# Patient Record
Sex: Female | Born: 1960 | Race: White | Hispanic: No | Smoking: Never smoker
Health system: Southern US, Community
[De-identification: ages and names within clinical notes are randomized; demographics above are authoritative.]

## PROBLEM LIST (undated history)

## (undated) DIAGNOSIS — J45909 Unspecified asthma, uncomplicated: Secondary | ICD-10-CM

## (undated) DIAGNOSIS — R112 Nausea with vomiting, unspecified: Secondary | ICD-10-CM

## (undated) DIAGNOSIS — J302 Other seasonal allergic rhinitis: Secondary | ICD-10-CM

## (undated) DIAGNOSIS — M545 Low back pain, unspecified: Secondary | ICD-10-CM

## (undated) DIAGNOSIS — T7840XA Allergy, unspecified, initial encounter: Secondary | ICD-10-CM

## (undated) DIAGNOSIS — Z9889 Other specified postprocedural states: Secondary | ICD-10-CM

## (undated) DIAGNOSIS — F32A Depression, unspecified: Secondary | ICD-10-CM

## (undated) DIAGNOSIS — R0602 Shortness of breath: Secondary | ICD-10-CM

## (undated) DIAGNOSIS — G4700 Insomnia, unspecified: Secondary | ICD-10-CM

## (undated) DIAGNOSIS — K219 Gastro-esophageal reflux disease without esophagitis: Secondary | ICD-10-CM

## (undated) DIAGNOSIS — F329 Major depressive disorder, single episode, unspecified: Secondary | ICD-10-CM

## (undated) DIAGNOSIS — I10 Essential (primary) hypertension: Secondary | ICD-10-CM

## (undated) DIAGNOSIS — F419 Anxiety disorder, unspecified: Secondary | ICD-10-CM

## (undated) DIAGNOSIS — E669 Obesity, unspecified: Secondary | ICD-10-CM

## (undated) HISTORY — DX: Obesity, unspecified: E66.9

## (undated) HISTORY — DX: Essential (primary) hypertension: I10

## (undated) HISTORY — PX: LASIK: SHX215

## (undated) HISTORY — DX: Allergy, unspecified, initial encounter: T78.40XA

## (undated) HISTORY — DX: Other specified postprocedural states: R11.2

## (undated) HISTORY — PX: AUGMENTATION MAMMAPLASTY: SUR837

## (undated) HISTORY — DX: Shortness of breath: R06.02

## (undated) HISTORY — DX: Other specified postprocedural states: Z98.890

## (undated) HISTORY — DX: Gastro-esophageal reflux disease without esophagitis: K21.9

## (undated) HISTORY — DX: Low back pain, unspecified: M54.50

---

## 1998-08-27 ENCOUNTER — Other Ambulatory Visit: Admission: RE | Admit: 1998-08-27 | Discharge: 1998-08-27 | Payer: Self-pay | Admitting: Family Medicine

## 2000-08-08 ENCOUNTER — Other Ambulatory Visit: Admission: RE | Admit: 2000-08-08 | Discharge: 2000-08-08 | Payer: Self-pay | Admitting: Family Medicine

## 2001-04-29 ENCOUNTER — Encounter: Admission: RE | Admit: 2001-04-29 | Discharge: 2001-04-29 | Payer: Self-pay | Admitting: Family Medicine

## 2001-04-29 ENCOUNTER — Encounter: Payer: Self-pay | Admitting: Family Medicine

## 2001-11-19 ENCOUNTER — Other Ambulatory Visit: Admission: RE | Admit: 2001-11-19 | Discharge: 2001-11-19 | Payer: Self-pay | Admitting: Emergency Medicine

## 2003-02-04 ENCOUNTER — Other Ambulatory Visit: Admission: RE | Admit: 2003-02-04 | Discharge: 2003-02-04 | Payer: Self-pay | Admitting: Family Medicine

## 2004-02-09 ENCOUNTER — Other Ambulatory Visit: Admission: RE | Admit: 2004-02-09 | Discharge: 2004-02-09 | Payer: Self-pay | Admitting: Family Medicine

## 2004-03-27 HISTORY — PX: LIPOSUCTION MULTIPLE BODY PARTS: SUR832

## 2004-03-27 HISTORY — PX: BREAST ENHANCEMENT SURGERY: SHX7

## 2004-08-18 ENCOUNTER — Ambulatory Visit (HOSPITAL_COMMUNITY): Admission: RE | Admit: 2004-08-18 | Discharge: 2004-08-18 | Payer: Self-pay | Admitting: Family Medicine

## 2005-04-14 ENCOUNTER — Other Ambulatory Visit: Admission: RE | Admit: 2005-04-14 | Discharge: 2005-04-14 | Payer: Self-pay | Admitting: Family Medicine

## 2006-07-13 ENCOUNTER — Other Ambulatory Visit: Admission: RE | Admit: 2006-07-13 | Discharge: 2006-07-13 | Payer: Self-pay | Admitting: Family Medicine

## 2007-03-28 HISTORY — PX: ORIF WRIST FRACTURE: SHX2133

## 2007-10-23 ENCOUNTER — Ambulatory Visit (HOSPITAL_COMMUNITY): Admission: RE | Admit: 2007-10-23 | Discharge: 2007-10-23 | Payer: Self-pay | Admitting: Family Medicine

## 2007-10-30 ENCOUNTER — Encounter: Admission: RE | Admit: 2007-10-30 | Discharge: 2007-10-30 | Payer: Self-pay | Admitting: Family Medicine

## 2008-02-12 ENCOUNTER — Other Ambulatory Visit: Admission: RE | Admit: 2008-02-12 | Discharge: 2008-02-12 | Payer: Self-pay | Admitting: Family Medicine

## 2009-01-26 ENCOUNTER — Encounter: Admission: RE | Admit: 2009-01-26 | Discharge: 2009-01-26 | Payer: Self-pay | Admitting: Family Medicine

## 2009-02-16 ENCOUNTER — Other Ambulatory Visit: Admission: RE | Admit: 2009-02-16 | Discharge: 2009-02-16 | Payer: Self-pay | Admitting: Family Medicine

## 2011-03-01 ENCOUNTER — Other Ambulatory Visit (HOSPITAL_COMMUNITY)
Admission: RE | Admit: 2011-03-01 | Discharge: 2011-03-01 | Disposition: A | Discharge status: Home / Self Care | Payer: BC Managed Care – PPO | Source: Ambulatory Visit | Attending: Family Medicine | Admitting: Family Medicine

## 2011-03-01 ENCOUNTER — Other Ambulatory Visit: Payer: Self-pay | Admitting: Family Medicine

## 2011-03-01 DIAGNOSIS — Z124 Encounter for screening for malignant neoplasm of cervix: Secondary | ICD-10-CM | POA: Insufficient documentation

## 2011-11-21 ENCOUNTER — Other Ambulatory Visit: Payer: Self-pay | Admitting: Family Medicine

## 2011-11-21 DIAGNOSIS — Z1231 Encounter for screening mammogram for malignant neoplasm of breast: Secondary | ICD-10-CM

## 2012-01-03 ENCOUNTER — Ambulatory Visit
Admission: RE | Admit: 2012-01-03 | Discharge: 2012-01-03 | Disposition: A | Discharge status: Home / Self Care | Payer: BC Managed Care – PPO | Source: Ambulatory Visit | Attending: Family Medicine | Admitting: Family Medicine

## 2012-01-03 DIAGNOSIS — Z1231 Encounter for screening mammogram for malignant neoplasm of breast: Secondary | ICD-10-CM

## 2013-10-01 ENCOUNTER — Other Ambulatory Visit: Payer: Self-pay

## 2013-10-01 DIAGNOSIS — Z1231 Encounter for screening mammogram for malignant neoplasm of breast: Secondary | ICD-10-CM

## 2013-10-08 ENCOUNTER — Other Ambulatory Visit: Payer: Self-pay

## 2013-10-08 ENCOUNTER — Ambulatory Visit
Admission: RE | Admit: 2013-10-08 | Discharge: 2013-10-08 | Disposition: A | Discharge status: Home / Self Care | Payer: BC Managed Care – PPO | Source: Ambulatory Visit

## 2013-10-08 DIAGNOSIS — Z1231 Encounter for screening mammogram for malignant neoplasm of breast: Secondary | ICD-10-CM

## 2014-04-22 ENCOUNTER — Other Ambulatory Visit: Payer: Self-pay | Admitting: Family Medicine

## 2014-04-22 ENCOUNTER — Other Ambulatory Visit (HOSPITAL_COMMUNITY)
Admission: RE | Admit: 2014-04-22 | Discharge: 2014-04-22 | Disposition: A | Discharge status: Home / Self Care | Payer: BLUE CROSS/BLUE SHIELD | Source: Ambulatory Visit | Attending: Family Medicine | Admitting: Family Medicine

## 2014-04-22 DIAGNOSIS — Z1151 Encounter for screening for human papillomavirus (HPV): Secondary | ICD-10-CM | POA: Insufficient documentation

## 2014-04-22 DIAGNOSIS — Z124 Encounter for screening for malignant neoplasm of cervix: Secondary | ICD-10-CM | POA: Diagnosis present

## 2014-04-24 LAB — CYTOLOGY - PAP

## 2014-04-28 ENCOUNTER — Institutional Professional Consult (permissible substitution): Payer: Self-pay | Admitting: Internal Medicine

## 2014-04-28 ENCOUNTER — Other Ambulatory Visit: Payer: Self-pay | Admitting: Orthopedic Surgery

## 2014-04-28 ENCOUNTER — Encounter (HOSPITAL_BASED_OUTPATIENT_CLINIC_OR_DEPARTMENT_OTHER): Payer: Self-pay | Admitting: *Deleted

## 2014-04-28 NOTE — Progress Notes (Signed)
No labs needed

## 2014-04-29 ENCOUNTER — Ambulatory Visit (HOSPITAL_BASED_OUTPATIENT_CLINIC_OR_DEPARTMENT_OTHER)
Admission: RE | Admit: 2014-04-29 | Discharge: 2014-04-29 | Disposition: A | Discharge status: Home / Self Care | Payer: BLUE CROSS/BLUE SHIELD | Source: Ambulatory Visit | Attending: Orthopedic Surgery | Admitting: Orthopedic Surgery

## 2014-04-29 ENCOUNTER — Encounter (HOSPITAL_BASED_OUTPATIENT_CLINIC_OR_DEPARTMENT_OTHER)
Admission: RE | Disposition: A | Discharge status: Home / Self Care | Payer: Self-pay | Source: Ambulatory Visit | Attending: Orthopedic Surgery

## 2014-04-29 ENCOUNTER — Ambulatory Visit (HOSPITAL_BASED_OUTPATIENT_CLINIC_OR_DEPARTMENT_OTHER): Payer: BLUE CROSS/BLUE SHIELD | Admitting: Anesthesiology

## 2014-04-29 ENCOUNTER — Encounter (HOSPITAL_BASED_OUTPATIENT_CLINIC_OR_DEPARTMENT_OTHER): Payer: Self-pay | Admitting: *Deleted

## 2014-04-29 DIAGNOSIS — S52572A Other intraarticular fracture of lower end of left radius, initial encounter for closed fracture: Secondary | ICD-10-CM | POA: Insufficient documentation

## 2014-04-29 DIAGNOSIS — K219 Gastro-esophageal reflux disease without esophagitis: Secondary | ICD-10-CM | POA: Insufficient documentation

## 2014-04-29 DIAGNOSIS — G4700 Insomnia, unspecified: Secondary | ICD-10-CM | POA: Diagnosis not present

## 2014-04-29 DIAGNOSIS — Z9181 History of falling: Secondary | ICD-10-CM | POA: Insufficient documentation

## 2014-04-29 DIAGNOSIS — Z888 Allergy status to other drugs, medicaments and biological substances status: Secondary | ICD-10-CM | POA: Insufficient documentation

## 2014-04-29 DIAGNOSIS — Z880 Allergy status to penicillin: Secondary | ICD-10-CM | POA: Insufficient documentation

## 2014-04-29 DIAGNOSIS — F329 Major depressive disorder, single episode, unspecified: Secondary | ICD-10-CM | POA: Diagnosis not present

## 2014-04-29 DIAGNOSIS — J45909 Unspecified asthma, uncomplicated: Secondary | ICD-10-CM | POA: Insufficient documentation

## 2014-04-29 DIAGNOSIS — Z6837 Body mass index (BMI) 37.0-37.9, adult: Secondary | ICD-10-CM | POA: Insufficient documentation

## 2014-04-29 DIAGNOSIS — F419 Anxiety disorder, unspecified: Secondary | ICD-10-CM | POA: Diagnosis not present

## 2014-04-29 HISTORY — PX: ORIF WRIST FRACTURE: SHX2133

## 2014-04-29 HISTORY — DX: Major depressive disorder, single episode, unspecified: F32.9

## 2014-04-29 HISTORY — DX: Unspecified asthma, uncomplicated: J45.909

## 2014-04-29 HISTORY — DX: Depression, unspecified: F32.A

## 2014-04-29 HISTORY — DX: Gastro-esophageal reflux disease without esophagitis: K21.9

## 2014-04-29 HISTORY — DX: Anxiety disorder, unspecified: F41.9

## 2014-04-29 HISTORY — DX: Insomnia, unspecified: G47.00

## 2014-04-29 HISTORY — DX: Other seasonal allergic rhinitis: J30.2

## 2014-04-29 LAB — POCT HEMOGLOBIN-HEMACUE: Hemoglobin: 12.6 g/dL (ref 12.0–15.0)

## 2014-04-29 SURGERY — OPEN REDUCTION INTERNAL FIXATION (ORIF) WRIST FRACTURE
Anesthesia: General | Site: Wrist | Laterality: Left

## 2014-04-29 MED ORDER — CEFAZOLIN SODIUM 1-5 GM-% IV SOLN
INTRAVENOUS | Status: AC
  Filled 2014-04-29: qty 100

## 2014-04-29 MED ORDER — PROPOFOL 10 MG/ML IV BOLUS
INTRAVENOUS | Status: DC | PRN
  Administered 2014-04-29: 120 mg via INTRAVENOUS

## 2014-04-29 MED ORDER — DEXAMETHASONE SODIUM PHOSPHATE 10 MG/ML IJ SOLN
INTRAMUSCULAR | Status: DC | PRN
  Administered 2014-04-29: 10 mg via INTRAVENOUS

## 2014-04-29 MED ORDER — CEFAZOLIN SODIUM 1 G IJ SOLR: 1.0000 g | Freq: Once | INTRAMUSCULAR | Status: DC

## 2014-04-29 MED ORDER — CEFAZOLIN SODIUM 1-5 GM-% IV SOLN
1.0000 g | Freq: Once | INTRAVENOUS | Status: AC
  Administered 2014-04-29: 1 g via INTRAVENOUS

## 2014-04-29 MED ORDER — FENTANYL CITRATE 0.05 MG/ML IJ SOLN
50.0000 ug | INTRAMUSCULAR | Status: DC | PRN
  Administered 2014-04-29: 100 ug via INTRAVENOUS

## 2014-04-29 MED ORDER — OXYCODONE HCL 5 MG PO TABS: 5.0000 mg | ORAL_TABLET | Freq: Once | ORAL | Status: DC | PRN

## 2014-04-29 MED ORDER — LACTATED RINGERS IV SOLN
INTRAVENOUS | Status: DC
  Administered 2014-04-29 (×2): via INTRAVENOUS

## 2014-04-29 MED ORDER — MIDAZOLAM HCL 2 MG/2ML IJ SOLN
1.0000 mg | INTRAMUSCULAR | Status: DC | PRN
  Administered 2014-04-29: 2 mg via INTRAVENOUS

## 2014-04-29 MED ORDER — PROPOFOL 10 MG/ML IV BOLUS
INTRAVENOUS | Status: AC
  Filled 2014-04-29: qty 40

## 2014-04-29 MED ORDER — BUPIVACAINE-EPINEPHRINE (PF) 0.5% -1:200000 IJ SOLN
INTRAMUSCULAR | Status: DC | PRN
  Administered 2014-04-29: 25 mL via PERINEURAL

## 2014-04-29 MED ORDER — CHLORHEXIDINE GLUCONATE 4 % EX LIQD: 60.0000 mL | Freq: Once | CUTANEOUS | Status: DC

## 2014-04-29 MED ORDER — OXYCODONE HCL 5 MG/5ML PO SOLN: 5.0000 mg | Freq: Once | ORAL | Status: DC | PRN

## 2014-04-29 MED ORDER — MIDAZOLAM HCL 2 MG/2ML IJ SOLN
INTRAMUSCULAR | Status: AC
Start: 2014-04-29 — End: 2014-04-29
  Filled 2014-04-29: qty 2

## 2014-04-29 MED ORDER — FENTANYL CITRATE 0.05 MG/ML IJ SOLN
INTRAMUSCULAR | Status: AC
  Filled 2014-04-29: qty 2

## 2014-04-29 MED ORDER — ONDANSETRON HCL 4 MG/2ML IJ SOLN: 4.0000 mg | Freq: Once | INTRAMUSCULAR | Status: DC | PRN

## 2014-04-29 MED ORDER — ONDANSETRON HCL 4 MG/2ML IJ SOLN
INTRAMUSCULAR | Status: DC | PRN
  Administered 2014-04-29: 4 mg via INTRAVENOUS

## 2014-04-29 MED ORDER — CEFAZOLIN SODIUM-DEXTROSE 2-3 GM-% IV SOLR
2.0000 g | INTRAVENOUS | Status: AC
Start: 2014-04-30 — End: 2014-04-29
  Administered 2014-04-29: 2 g via INTRAVENOUS

## 2014-04-29 MED ORDER — HYDROMORPHONE HCL 1 MG/ML IJ SOLN: 0.2500 mg | INTRAMUSCULAR | Status: DC | PRN

## 2014-04-29 MED ORDER — FENTANYL CITRATE 0.05 MG/ML IJ SOLN
INTRAMUSCULAR | Status: AC
  Filled 2014-04-29: qty 4

## 2014-04-29 MED ORDER — CEFAZOLIN SODIUM 1-5 GM-% IV SOLN
INTRAVENOUS | Status: AC
  Filled 2014-04-29: qty 50

## 2014-04-29 SURGICAL SUPPLY — 78 items
APL SKNCLS STERI-STRIP NONHPOA (GAUZE/BANDAGES/DRESSINGS) ×1
BAG DECANTER FOR FLEXI CONT (MISCELLANEOUS) IMPLANT
BANDAGE ELASTIC 3 VELCRO ST LF (GAUZE/BANDAGES/DRESSINGS) ×4 IMPLANT
BANDAGE ELASTIC 4 VELCRO ST LF (GAUZE/BANDAGES/DRESSINGS) ×2 IMPLANT
BENZOIN TINCTURE PRP APPL 2/3 (GAUZE/BANDAGES/DRESSINGS) ×2 IMPLANT
BIT DRILL 2 FAST STEP (BIT) ×2 IMPLANT
BIT DRILL 2.5X4 QC (BIT) ×2 IMPLANT
BLADE CLIPPER SURG (BLADE) IMPLANT
BLADE SURG 15 STRL LF DISP TIS (BLADE) ×2 IMPLANT
BLADE SURG 15 STRL SS (BLADE) ×6
BNDG CMPR 9X4 STRL LF SNTH (GAUZE/BANDAGES/DRESSINGS) ×1
BNDG ESMARK 4X9 LF (GAUZE/BANDAGES/DRESSINGS) ×2 IMPLANT
CANISTER SUCT 1200ML W/VALVE (MISCELLANEOUS) IMPLANT
CLOSURE STERI-STRIP 1/2X4 (GAUZE/BANDAGES/DRESSINGS) ×1
CLOSURE WOUND 1/2 X4 (GAUZE/BANDAGES/DRESSINGS)
CLSR STERI-STRIP ANTIMIC 1/2X4 (GAUZE/BANDAGES/DRESSINGS) ×1 IMPLANT
COVER BACK TABLE 60X90IN (DRAPES) ×3 IMPLANT
COVER MAYO STAND STRL (DRAPES) ×3 IMPLANT
CUFF TOURNIQUET SINGLE 18IN (TOURNIQUET CUFF) ×2 IMPLANT
DECANTER SPIKE VIAL GLASS SM (MISCELLANEOUS) IMPLANT
DRAPE EXTREMITY T 121X128X90 (DRAPE) ×3 IMPLANT
DRAPE OEC MINIVIEW 54X84 (DRAPES) ×3 IMPLANT
DRAPE SURG 17X23 STRL (DRAPES) ×3 IMPLANT
DRSG EMULSION OIL 3X3 NADH (GAUZE/BANDAGES/DRESSINGS) IMPLANT
DURAPREP 26ML APPLICATOR (WOUND CARE) ×3 IMPLANT
ELECT REM PT RETURN 9FT ADLT (ELECTROSURGICAL) ×3
ELECTRODE REM PT RTRN 9FT ADLT (ELECTROSURGICAL) ×1 IMPLANT
GAUZE SPONGE 4X4 12PLY STRL (GAUZE/BANDAGES/DRESSINGS) ×3 IMPLANT
GLOVE BIO SURGEON STRL SZ7.5 (GLOVE) ×2 IMPLANT
GLOVE BIOGEL M STRL SZ7.5 (GLOVE) ×2 IMPLANT
GLOVE BIOGEL PI IND STRL 7.0 (GLOVE) IMPLANT
GLOVE BIOGEL PI IND STRL 8 (GLOVE) ×2 IMPLANT
GLOVE BIOGEL PI INDICATOR 7.0 (GLOVE) ×2
GLOVE BIOGEL PI INDICATOR 8 (GLOVE) ×4
GLOVE ECLIPSE 7.5 STRL STRAW (GLOVE) ×6 IMPLANT
GOWN STRL REUS W/ TWL LRG LVL3 (GOWN DISPOSABLE) ×1 IMPLANT
GOWN STRL REUS W/ TWL XL LVL3 (GOWN DISPOSABLE) ×1 IMPLANT
GOWN STRL REUS W/TWL LRG LVL3 (GOWN DISPOSABLE) ×3
GOWN STRL REUS W/TWL XL LVL3 (GOWN DISPOSABLE) ×6 IMPLANT
K-WIRE .062X4 (WIRE) ×2 IMPLANT
NEEDLE HYPO 22GX1.5 SAFETY (NEEDLE) IMPLANT
NS IRRIG 1000ML POUR BTL (IV SOLUTION) IMPLANT
PACK BASIN DAY SURGERY FS (CUSTOM PROCEDURE TRAY) ×3 IMPLANT
PAD CAST 3X4 CTTN HI CHSV (CAST SUPPLIES) ×1 IMPLANT
PADDING CAST ABS 4INX4YD NS (CAST SUPPLIES) ×2
PADDING CAST ABS COTTON 4X4 ST (CAST SUPPLIES) ×1 IMPLANT
PADDING CAST COTTON 3X4 STRL (CAST SUPPLIES) ×3
PADDING UNDERCAST 2 STRL (CAST SUPPLIES)
PADDING UNDERCAST 2X4 STRL (CAST SUPPLIES) IMPLANT
PEG SUBCHONDRAL SMOOTH 2.0X16 (Peg) ×2 IMPLANT
PEG SUBCHONDRAL SMOOTH 2.0X18 (Peg) ×8 IMPLANT
PEG SUBCHONDRAL SMOOTH 2.0X22 (Peg) ×4 IMPLANT
PENCIL BUTTON HOLSTER BLD 10FT (ELECTRODE) ×3 IMPLANT
PLATE SHORT 24.4X51.3 LT (Plate) ×2 IMPLANT
SCREW BN 12X3.5XNS CORT TI (Screw) IMPLANT
SCREW CORT 3.5X12 (Screw) ×6 IMPLANT
SCREW CORT 3.5X14 LNG (Screw) ×2 IMPLANT
SLING ARM MED ADULT FOAM STRAP (SOFTGOODS) ×2 IMPLANT
SPLINT FIBERGLASS 3X35 (CAST SUPPLIES) ×2 IMPLANT
SPLINT PLASTER CAST XFAST 3X15 (CAST SUPPLIES) IMPLANT
SPLINT PLASTER XTRA FASTSET 3X (CAST SUPPLIES)
STOCKINETTE 4X48 STRL (DRAPES) ×3 IMPLANT
STRIP CLOSURE SKIN 1/2X4 (GAUZE/BANDAGES/DRESSINGS) IMPLANT
SUCTION FRAZIER TIP 10 FR DISP (SUCTIONS) ×2 IMPLANT
SUT BONE WAX W31G (SUTURE) IMPLANT
SUT MNCRL AB 3-0 PS2 18 (SUTURE) ×3 IMPLANT
SUT VIC AB 1 CT1 27 (SUTURE)
SUT VIC AB 1 CT1 27XBRD ANBCTR (SUTURE) IMPLANT
SUT VIC AB 2-0 SH 27 (SUTURE) ×3
SUT VIC AB 2-0 SH 27XBRD (SUTURE) IMPLANT
SUT VICRYL 4-0 PS2 18IN ABS (SUTURE) ×3 IMPLANT
SYR BULB 3OZ (MISCELLANEOUS) ×3 IMPLANT
SYR CONTROL 10ML LL (SYRINGE) IMPLANT
TOWEL OR 17X24 6PK STRL BLUE (TOWEL DISPOSABLE) ×3 IMPLANT
TOWEL OR NON WOVEN STRL DISP B (DISPOSABLE) ×3 IMPLANT
TUBE CONNECTING 20'X1/4 (TUBING) ×1
TUBE CONNECTING 20X1/4 (TUBING) ×1 IMPLANT
UNDERPAD 30X30 INCONTINENT (UNDERPADS AND DIAPERS) ×3 IMPLANT

## 2014-04-29 NOTE — Brief Op Note (Signed)
04/29/2014  3:56 PM  PATIENT:  Ebony Herman  54 y.o. female  PRE-OPERATIVE DIAGNOSIS:  fractured left wrist  POST-OPERATIVE DIAGNOSIS:  fractured left wrist  PROCEDURE:  Procedure(s): OPEN REDUCTION INTERNAL FIXATION (ORIF) WRIST FRACTURE (Left)  SURGEON:  Surgeon(s) and Role:    * Alta Corning, MD - Primary  PHYSICIAN ASSISTANT:   ASSISTANTS: bethune   ANESTHESIA:   general  EBL:  Total I/O In: 1000 [I.V.:1000] Out: -   BLOOD ADMINISTERED:none  DRAINS: none   LOCAL MEDICATIONS USED:  NONE  SPECIMEN:  No Specimen  DISPOSITION OF SPECIMEN:  N/A  COUNTS:  YES  TOURNIQUET:  * Missing tourniquet times found for documented tourniquets in log:  201741 *  DICTATION: .Other Dictation: Dictation Number [id number]  PLAN OF CARE: Discharge to home after PACU  PATIENT DISPOSITION:  PACU - hemodynamically stable.   Delay start of Pharmacological VTE agent (>24hrs) due to surgical blood loss or risk of bleeding: no

## 2014-04-29 NOTE — Progress Notes (Signed)
  Assisted Dr. Crews with left, ultrasound guided, supraclavicular block. Side rails up, monitors on throughout procedure. See vital signs in flow sheet. Tolerated Procedure well. 

## 2014-04-29 NOTE — Anesthesia Postprocedure Evaluation (Signed)
  Anesthesia Post-op Note  Patient: Ebony Herman  Procedure(s) Performed: Procedure(s): OPEN REDUCTION INTERNAL FIXATION (ORIF) WRIST FRACTURE (Left)  Patient Location: PACU  Anesthesia Type:GA combined with regional for post-op pain  Level of Consciousness: awake and alert   Airway and Oxygen Therapy: Patient Spontanous Breathing  Post-op Pain: none  Post-op Assessment: Post-op Vital signs reviewed  Post-op Vital Signs: Reviewed  Last Vitals:  Filed Vitals:   04/29/14 1630  BP:   Pulse: 102  Temp:   Resp: 17    Complications: No apparent anesthesia complications

## 2014-04-29 NOTE — Transfer of Care (Signed)
Immediate Anesthesia Transfer of Care Note  Patient: Ebony Herman  Procedure(s) Performed: Procedure(s): OPEN REDUCTION INTERNAL FIXATION (ORIF) WRIST FRACTURE (Left)  Patient Location: PACU  Anesthesia Type:General and GA combined with regional for post-op pain  Level of Consciousness: awake and alert   Airway & Oxygen Therapy: Patient Spontanous Breathing and Patient connected to face mask oxygen  Post-op Assessment: Report given to RN and Post -op Vital signs reviewed and stable  Post vital signs: Reviewed and stable  Last Vitals:  Filed Vitals:   04/29/14 1441  BP:   Pulse: 93  Temp:   Resp: 18    Complications: No apparent anesthesia complications

## 2014-04-29 NOTE — Anesthesia Procedure Notes (Addendum)
Anesthesia Regional Block:  Supraclavicular block  Pre-Anesthetic Checklist: ,, timeout performed, Correct Patient, Correct Site, Correct Laterality, Correct Procedure, Correct Position, site marked, Risks and benefits discussed,  Surgical consent,  Pre-op evaluation,  At surgeon's request and post-op pain management  Laterality: Left and Upper  Prep: chloraprep       Needles:  Injection technique: Single-shot  Needle Type: Echogenic Stimulator Needle     Needle Length: 5cm 5 cm Needle Gauge: 21 and 21 G    Additional Needles:  Procedures: ultrasound guided (picture in chart) Supraclavicular block Narrative:  Start time: 04/29/2014 2:07 PM End time: 04/29/2014 2:12 PM Injection made incrementally with aspirations every 5 mL.  Performed by: Personally  Anesthesiologist: CREWS, DAVID A   Procedure Name: LMA Insertion Date/Time: 04/29/2014 2:53 PM Performed by: Maryella Shivers Pre-anesthesia Checklist: Patient identified, Emergency Drugs available, Suction available and Patient being monitored Patient Re-evaluated:Patient Re-evaluated prior to inductionOxygen Delivery Method: Circle System Utilized Preoxygenation: Pre-oxygenation with 100% oxygen Intubation Type: IV induction Ventilation: Mask ventilation without difficulty LMA: LMA inserted LMA Size: 4.0 Number of attempts: 1 Airway Equipment and Method: Bite block Placement Confirmation: positive ETCO2 Tube secured with: Tape Dental Injury: Teeth and Oropharynx as per pre-operative assessment

## 2014-04-29 NOTE — Discharge Instructions (Signed)
Elevate and ice your wrist as much as possible for a few days. Move your fingers as tolerated.   Post Anesthesia Home Care Instructions  Activity: Get plenty of rest for the remainder of the day. A responsible adult should stay with you for 24 hours following the procedure.  For the next 24 hours, DO NOT: -Drive a car -Paediatric nurse -Drink alcoholic beverages -Take any medication unless instructed by your physician -Make any legal decisions or sign important papers.  Meals: Start with liquid foods such as gelatin or soup. Progress to regular foods as tolerated. Avoid greasy, spicy, heavy foods. If nausea and/or vomiting occur, drink only clear liquids until the nausea and/or vomiting subsides. Call your physician if vomiting continues.  Special Instructions/Symptoms: Your throat may feel dry or sore from the anesthesia or the breathing tube placed in your throat during surgery. If this causes discomfort, gargle with warm salt water. The discomfort should disappear within 24 hours. Regional Anesthesia Blocks  1. Numbness or the inability to move the "blocked" extremity may last from 3-48 hours after placement. The length of time depends on the medication injected and your individual response to the medication. If the numbness is not going away after 48 hours, call your surgeon.  2. The extremity that is blocked will need to be protected until the numbness is gone and the  Strength has returned. Because you cannot feel it, you will need to take extra care to avoid injury. Because it may be weak, you may have difficulty moving it or using it. You may not know what position it is in without looking at it while the block is in effect.  3. For blocks in the legs and feet, returning to weight bearing and walking needs to be done carefully. You will need to wait until the numbness is entirely gone and the strength has returned. You should be able to move your leg and foot normally before you  try and bear weight or walk. You will need someone to be with you when you first try to ensure you do not fall and possibly risk injury.  4. Bruising and tenderness at the needle site are common side effects and will resolve in a few days.  5. Persistent numbness or new problems with movement should be communicated to the surgeon or the [facility] [phone] [place] [phone]).

## 2014-04-29 NOTE — H&P (Signed)
PREOPERATIVE H&P  Chief Complaint: left wrist pain   HPI: Ebony Herman is a 54 y.o. female who presents for evaluation of l wrist pain with intra-articular fracture and severe displcement. It has been present for 2 days and has been worsening. She has failed conservative measures. Pain is rated as severe.  Past Medical History  Diagnosis Date  . Anxiety   . Depression   . GERD (gastroesophageal reflux disease)   . Insomnia   . Seasonal allergies   . Asthma    Past Surgical History  Procedure Laterality Date  . Breast enhancement surgery  2006    implants  . Liposuction multiple body parts  2006  . Orif wrist fracture  2009    right  . Lasik     History   Social History  . Marital Status: Married    Spouse Name: N/A    Number of Children: N/A  . Years of Education: N/A   Social History Main Topics  . Smoking status: Never Smoker   . Smokeless tobacco: None  . Alcohol Use: Yes     Comment: rare  . Drug Use: No  . Sexual Activity: None   Other Topics Concern  . None   Social History Narrative  . None   History reviewed. No pertinent family history. Allergies  Allergen Reactions  . Flonase [Fluticasone Propionate]     bleeding  . Penicillins Hives    itch   Prior to Admission medications   Medication Sig Start Date End Date Taking? Authorizing Provider  albuterol (PROVENTIL HFA;VENTOLIN HFA) 108 (90 BASE) MCG/ACT inhaler Inhale into the lungs every 6 (six) hours as needed for wheezing or shortness of breath.   Yes Historical Provider, MD  ALPRAZolam Duanne Moron) 0.5 MG tablet Take 1.5 mg by mouth 3 (three) times daily as needed for anxiety.   Yes Historical Provider, MD  buPROPion (WELLBUTRIN XL) 300 MG 24 hr tablet Take 300 mg by mouth daily.   Yes Historical Provider, MD  drospirenone-ethinyl estradiol (YAZ,GIANVI,LORYNA) 3-0.02 MG tablet Take 1 tablet by mouth daily.   Yes Historical Provider, MD  FLUoxetine (PROZAC) 40 MG capsule Take 40 mg by mouth every  evening.   Yes Historical Provider, MD  Fluticasone-Salmeterol (ADVAIR) 100-50 MCG/DOSE AEPB Inhale 1 puff into the lungs 2 (two) times daily.   Yes Historical Provider, MD  HYDROmorphone (DILAUDID) 2 MG tablet Take by mouth every 4 (four) hours as needed for severe pain.   Yes Historical Provider, MD  loratadine (CLARITIN) 10 MG tablet Take 10 mg by mouth daily.   Yes Historical Provider, MD  omeprazole (PRILOSEC) 20 MG capsule Take 20 mg by mouth daily.   Yes Historical Provider, MD  zolpidem (AMBIEN) 10 MG tablet Take 10 mg by mouth at bedtime as needed for sleep.   Yes Historical Provider, MD     Positive ROS: none  All other systems have been reviewed and were otherwise negative with the exception of those mentioned in the HPI and as above.  Physical Exam: There were no vitals filed for this visit.  General: Alert, no acute distress Cardiovascular: No pedal edema Respiratory: No cyanosis, no use of accessory musculature GI: No organomegaly, abdomen is soft and non-tender Skin: No lesions in the area of chief complaint Neurologic: Sensation intact distally Psychiatric: Patient is competent for consent with normal mood and affect Lymphatic: No axillary or cervical lymphadenopathy  MUSCULOSKELETAL: l wrist markedly deformed and swollen// nvi distally  XRAY: intra-articular displaced distal radius  fracture  Assessment/Plan: fractured left wrist Plan for Procedure(s): OPEN REDUCTION INTERNAL FIXATION (ORIF) WRIST FRACTURE  The risks benefits and alternatives were discussed with the patient including but not limited to the risks of nonoperative treatment, versus surgical intervention including infection, bleeding, nerve injury, malunion, nonunion, hardware prominence, hardware failure, need for hardware removal, blood clots, cardiopulmonary complications, morbidity, mortality, among others, and they were willing to proceed.  Predicted outcome is good, although there will be at least  a six to nine month expected recovery.  Alta Corning, MD 04/29/2014 1:26 PM

## 2014-04-29 NOTE — Anesthesia Preprocedure Evaluation (Signed)
Anesthesia Evaluation  Patient identified by MRN, date of birth, ID band Patient awake    Reviewed: Allergy & Precautions, NPO status , Patient's Chart, lab work & pertinent test results  Airway Mallampati: I  TM Distance: >3 FB Neck ROM: Full    Dental  (+) Teeth Intact, Dental Advisory Given   Pulmonary asthma ,  breath sounds clear to auscultation        Cardiovascular Rhythm:Regular Rate:Normal     Neuro/Psych    GI/Hepatic   Endo/Other  Morbid obesity  Renal/GU      Musculoskeletal   Abdominal   Peds  Hematology   Anesthesia Other Findings   Reproductive/Obstetrics                             Anesthesia Physical Anesthesia Plan  ASA: II  Anesthesia Plan: General   Post-op Pain Management:    Induction: Intravenous  Airway Management Planned: LMA  Additional Equipment:   Intra-op Plan:   Post-operative Plan: Extubation in OR  Informed Consent: I have reviewed the patients History and Physical, chart, labs and discussed the procedure including the risks, benefits and alternatives for the proposed anesthesia with the patient or authorized representative who has indicated his/her understanding and acceptance.   Dental advisory given  Plan Discussed with: CRNA, Anesthesiologist and Surgeon  Anesthesia Plan Comments:         Anesthesia Quick Evaluation

## 2014-04-30 ENCOUNTER — Encounter (HOSPITAL_BASED_OUTPATIENT_CLINIC_OR_DEPARTMENT_OTHER): Payer: Self-pay | Admitting: Orthopedic Surgery

## 2014-04-30 NOTE — Op Note (Signed)
NAMEKANI, JOBSON             ACCOUNT NO.:  [id number]  MEDICAL RECORD NO.:  [id number]  LOCATION:                                 FACILITY:  PHYSICIAN:  Alta Corning, M.D.   DATE OF BIRTH:  12-19-1960  DATE OF PROCEDURE:  04/29/2014 DATE OF DISCHARGE:  04/29/2014                              OPERATIVE REPORT   PREOPERATIVE DIAGNOSIS:  Distal radius fracture with multiple intra- articular fragments.  POSTOPERATIVE DIAGNOSIS:  Distal radius fracture with multiple intra- articular fragments.  PROCEDURE: 1: Open reduction and internal fixation of left distal radius fracture with fixation of greater than 3 articular fragments. 2:interpretation of multiple intraoperative fluoroscopic images  SURGEON:  Alta Corning, M.D.  ASSISTANT:  Gary Fleet, PA  ANESTHESIA:  General.  BRIEF HISTORY:  Ms. Chyrl Civatte is a 54 year old female with a history of having falling on her left wrist.  She had a previous wrist fracture about 8 years ago.  She had done great with that, but because of the pain, she came to see [place] in the office.  X-ray showed that she had a distal radius fracture dramatically displaced with intra-articular extension.  We talked about treatment options.  We felt that open reduction and internal fixation was the only reasonable course of action.  She is brought to the operating room for this procedure.  PROCEDURE:  The patient was brought to the operating room.  After adequate anesthesia was obtained with general anesthetic, the patient was placed supine on the operating table.  The left arm was prepped and draped in sterile fashion.  Following this, the arm was exsanguinated, blood pressure tourniquet inflated to 250 mmHg.  Following this, an incision was made along the course of the flexor carpi radialis tendon subcutaneous tissue down the level of the sheath.  The tendon was retracted radially.  Incision was made in the fascia subcutaneous tissue down the  level of the bone and the pronator quadratus was identified and released on its radial border.  The fracture was then identified and a closed manipulative reduction was undertaken.  The plate was placed in the anatomic position and a guidewire placed in the plate to make sure that we were in the appropriate position.  Once we were satisfied we were in the appropriate position, a gliding hole was used and a drill was made.  The plate was then adjusted to its appropriate position under fluoroscopic imaging.  We tested a guidewire to make sure that it would work distally and it did and at that point, we felt that we were perfectly positioned and the 7 distal pegs were placed followed by the remaining 2 proximal screws.  Once this was done, the wounds were irrigated, suctioned dry, closed in layers.  The pronator quadratus was closed with 4-0 Vicryl and the skin with 2-0 Vicryl and 3-0 Monocryl subcuticular.  Benzoin and Steri-Strips were applied.  Sterile compressive dressing was applied, and the patient was taken to recovery room and was noted to be in satisfactory condition.  Estimated blood loss for the procedure was minimal.     Alta Corning, M.D.     Corliss Skains  D:  04/29/2014  T:  04/30/2014  Job:  [id number]  cc:   Alta Corning, M.D.

## 2014-10-30 ENCOUNTER — Other Ambulatory Visit: Payer: Self-pay | Admitting: Plastic Surgery

## 2015-07-08 ENCOUNTER — Ambulatory Visit (INDEPENDENT_AMBULATORY_CARE_PROVIDER_SITE_OTHER): Payer: BLUE CROSS/BLUE SHIELD | Admitting: Psychology

## 2015-07-08 DIAGNOSIS — F4323 Adjustment disorder with mixed anxiety and depressed mood: Secondary | ICD-10-CM | POA: Diagnosis not present

## 2015-08-17 ENCOUNTER — Ambulatory Visit: Payer: BLUE CROSS/BLUE SHIELD | Admitting: Psychology

## 2016-02-14 ENCOUNTER — Ambulatory Visit (INDEPENDENT_AMBULATORY_CARE_PROVIDER_SITE_OTHER): Payer: BLUE CROSS/BLUE SHIELD | Admitting: Psychology

## 2016-02-14 ENCOUNTER — Ambulatory Visit: Payer: BLUE CROSS/BLUE SHIELD | Admitting: Psychology

## 2016-02-14 DIAGNOSIS — F331 Major depressive disorder, recurrent, moderate: Secondary | ICD-10-CM | POA: Diagnosis not present

## 2016-02-24 ENCOUNTER — Ambulatory Visit (INDEPENDENT_AMBULATORY_CARE_PROVIDER_SITE_OTHER): Payer: BLUE CROSS/BLUE SHIELD | Admitting: Psychology

## 2016-02-24 DIAGNOSIS — F331 Major depressive disorder, recurrent, moderate: Secondary | ICD-10-CM

## 2016-03-30 ENCOUNTER — Ambulatory Visit (INDEPENDENT_AMBULATORY_CARE_PROVIDER_SITE_OTHER): Payer: BLUE CROSS/BLUE SHIELD | Admitting: Psychology

## 2016-03-30 DIAGNOSIS — F330 Major depressive disorder, recurrent, mild: Secondary | ICD-10-CM | POA: Diagnosis not present

## 2016-05-17 ENCOUNTER — Ambulatory Visit (INDEPENDENT_AMBULATORY_CARE_PROVIDER_SITE_OTHER): Payer: BLUE CROSS/BLUE SHIELD | Admitting: Psychology

## 2016-05-17 DIAGNOSIS — F331 Major depressive disorder, recurrent, moderate: Secondary | ICD-10-CM | POA: Diagnosis not present

## 2016-05-24 ENCOUNTER — Other Ambulatory Visit: Payer: Self-pay | Admitting: Family Medicine

## 2016-05-24 DIAGNOSIS — Z1231 Encounter for screening mammogram for malignant neoplasm of breast: Secondary | ICD-10-CM

## 2016-06-01 ENCOUNTER — Encounter: Payer: Self-pay | Admitting: Internal Medicine

## 2016-06-07 ENCOUNTER — Ambulatory Visit: Payer: BLUE CROSS/BLUE SHIELD | Admitting: Psychology

## 2016-06-12 ENCOUNTER — Other Ambulatory Visit: Payer: Self-pay | Admitting: Family Medicine

## 2016-06-12 ENCOUNTER — Ambulatory Visit
Admission: RE | Admit: 2016-06-12 | Discharge: 2016-06-12 | Disposition: A | Discharge status: Home / Self Care | Payer: BLUE CROSS/BLUE SHIELD | Source: Ambulatory Visit | Attending: Family Medicine | Admitting: Family Medicine

## 2016-06-12 DIAGNOSIS — Z1231 Encounter for screening mammogram for malignant neoplasm of breast: Secondary | ICD-10-CM

## 2016-07-26 ENCOUNTER — Ambulatory Visit (AMBULATORY_SURGERY_CENTER): Payer: Self-pay | Admitting: *Deleted

## 2016-07-26 VITALS — Ht 62.0 in | Wt 206.0 lb

## 2016-07-26 DIAGNOSIS — Z1211 Encounter for screening for malignant neoplasm of colon: Secondary | ICD-10-CM

## 2016-07-26 DIAGNOSIS — Z800 Family history of malignant neoplasm of digestive organs: Secondary | ICD-10-CM

## 2016-07-26 MED ORDER — NA SULFATE-K SULFATE-MG SULF 17.5-3.13-1.6 GM/177ML PO SOLN: ORAL | 0 refills | Status: DC

## 2016-07-26 NOTE — Progress Notes (Signed)
Patient denies any allergies to eggs or soy. Patient has post-op n&v with anesthesia. Patient denies any oxygen use at home and does not take any diet/weight loss medications. Patient refused EMMI education.

## 2016-08-14 ENCOUNTER — Encounter: Payer: Self-pay | Admitting: Internal Medicine

## 2016-08-23 ENCOUNTER — Ambulatory Visit (AMBULATORY_SURGERY_CENTER): Payer: BLUE CROSS/BLUE SHIELD | Admitting: Internal Medicine

## 2016-08-23 ENCOUNTER — Encounter: Payer: Self-pay | Admitting: Internal Medicine

## 2016-08-23 VITALS — BP 142/84 | HR 69 | Temp 96.9°F | Resp 22 | Ht 62.0 in | Wt 206.0 lb

## 2016-08-23 DIAGNOSIS — Z1211 Encounter for screening for malignant neoplasm of colon: Secondary | ICD-10-CM

## 2016-08-23 DIAGNOSIS — Z1212 Encounter for screening for malignant neoplasm of rectum: Secondary | ICD-10-CM

## 2016-08-23 DIAGNOSIS — Z800 Family history of malignant neoplasm of digestive organs: Secondary | ICD-10-CM

## 2016-08-23 DIAGNOSIS — D122 Benign neoplasm of ascending colon: Secondary | ICD-10-CM | POA: Diagnosis not present

## 2016-08-23 DIAGNOSIS — K635 Polyp of colon: Secondary | ICD-10-CM | POA: Diagnosis not present

## 2016-08-23 DIAGNOSIS — D125 Benign neoplasm of sigmoid colon: Secondary | ICD-10-CM

## 2016-08-23 MED ORDER — SODIUM CHLORIDE 0.9 % IV SOLN: 500.0000 mL | INTRAVENOUS | Status: AC

## 2016-08-23 NOTE — Op Note (Signed)
[place] Patient Name: Ebony Herman Procedure Date: 08/23/2016 9:47 AM MRN: [id number] Endoscopist: Jerene Bears , MD Age: 56 Referring MD:  Date of Birth: [birth year] Gender: Female Account #: [id number] Procedure:                Colonoscopy Indications:              Screening patient at increased risk: Family history                            of 1st-degree relative with colorectal cancer at                            age 50 years (or older), This is the patient's                            first colonoscopy Medicines:                Monitored Anesthesia Care Procedure:                Pre-Anesthesia Assessment:                           - Prior to the procedure, a History and Physical                            was performed, and patient medications and                            allergies were reviewed. The patient's tolerance of                            previous anesthesia was also reviewed. The risks                            and benefits of the procedure and the sedation                            options and risks were discussed with the patient.                            All questions were answered, and informed consent                            was obtained. Prior Anticoagulants: The patient has                            taken no previous anticoagulant or antiplatelet                            agents. ASA Grade Assessment: II - A patient with                            mild systemic disease. After reviewing the risks  and benefits, the patient was deemed in                            satisfactory condition to undergo the procedure.                           After obtaining informed consent, the colonoscope                            was passed under direct vision. Throughout the                            procedure, the patient's blood pressure, pulse, and                            oxygen saturations were monitored  continuously. The                            Model PCF-H190DL [id number] [id number]) scope was introduced                            through the anus and advanced to the the terminal                            ileum. The colonoscopy was performed without                            difficulty. The patient tolerated the procedure                            well. The quality of the bowel preparation was good                            (after copious irrigation and lavage). The terminal                            ileum, ileocecal valve, appendiceal orifice, and                            rectum were photographed. Scope In: 9:54:55 AM Scope Out: 10:14:49 AM Scope Withdrawal Time: 0 hours 15 minutes 23 seconds  Total Procedure Duration: 0 hours 19 minutes 54 seconds  Findings:                 The digital rectal exam was normal.                           The terminal ileum appeared normal.                           A 5 mm polyp was found in the ascending colon. The                            polyp was sessile. The polyp was removed with a  cold snare. Resection and retrieval were complete.                           A 3 mm polyp was found in the sigmoid colon. The                            polyp was sessile. The polyp was removed with a                            cold snare. Resection and retrieval were complete.                           A few small-mouthed diverticula were found in the                            sigmoid colon.                           The retroflexed view of the distal rectum and anal                            verge was normal and showed no anal or rectal                            abnormalities. Complications:            No immediate complications. Estimated Blood Loss:     Estimated blood loss was minimal. Impression:               - The examined portion of the ileum was normal.                           - One 5 mm polyp in the ascending colon, removed                             with a cold snare. Resected and retrieved.                           - One 3 mm polyp in the sigmoid colon, removed with                            a cold snare. Resected and retrieved.                           - Mild diverticulosis in the sigmoid colon.                           - The distal rectum and anal verge are normal on                            retroflexion view. Recommendation:           - Patient has a contact number available for  emergencies. The signs and symptoms of potential                            delayed complications were discussed with the                            patient. Return to normal activities tomorrow.                            Written discharge instructions were provided to the                            patient.                           - Resume previous diet.                           - Continue present medications.                           - Await pathology results.                           - Repeat colonoscopy is recommended. The                            colonoscopy date will be determined after pathology                            results from today's exam become available for                            review. Jerene Bears, MD 08/23/2016 10:18:34 AM This report has been signed electronically.

## 2016-08-23 NOTE — Progress Notes (Signed)
Called to room to assist during endoscopic procedure.  Patient ID and intended procedure confirmed with present staff. Received instructions for my participation in the procedure from the performing physician.  

## 2016-08-23 NOTE — Patient Instructions (Signed)
YOU HAD AN ENDOSCOPIC PROCEDURE TODAY AT [facility]:   Refer to the procedure report that was given to you for any specific questions about what was found during the examination.  If the procedure report does not answer your questions, please call your gastroenterologist to clarify.  If you requested that your care partner not be given the details of your procedure findings, then the procedure report has been included in a sealed envelope for you to review at your convenience later.  YOU SHOULD EXPECT: Some feelings of bloating in the abdomen. Passage of more gas than usual.  Walking can help get rid of the air that was put into your GI tract during the procedure and reduce the bloating. If you had a lower endoscopy (such as a colonoscopy or flexible sigmoidoscopy) you may notice spotting of blood in your stool or on the toilet paper. If you underwent a bowel prep for your procedure, you may not have a normal bowel movement for a few days.  Please Note:  You might notice some irritation and congestion in your nose or some drainage.  This is from the oxygen used during your procedure.  There is no need for concern and it should clear up in a day or so.  SYMPTOMS TO REPORT IMMEDIATELY:   Following lower endoscopy (colonoscopy or flexible sigmoidoscopy):  Excessive amounts of blood in the stool  Significant tenderness or worsening of abdominal pains  Swelling of the abdomen that is new, acute  Fever of 100F or higher   For urgent or emergent issues, a gastroenterologist can be reached at any hour by calling [phone].   DIET:  We do recommend a small meal at first, but then you may proceed to your regular diet.  Drink plenty of fluids but you should avoid alcoholic beverages for 24 hours.  ACTIVITY:  You should plan to take it easy for the rest of today and you should NOT DRIVE or use heavy machinery until tomorrow (because of the sedation medicines used during the test).     FOLLOW UP: Our staff will call the number listed on your records the next business day following your procedure to check on you and address any questions or concerns that you may have regarding the information given to you following your procedure. If we do not reach you, we will leave a message.  However, if you are feeling well and you are not experiencing any problems, there is no need to return our call.  We will assume that you have returned to your regular daily activities without incident.  If any biopsies were taken you will be contacted by phone or by letter within the next 1-3 weeks.  Please call us at [phone] if you have not heard about the biopsies in 3 weeks.    SIGNATURES/CONFIDENTIALITY: You and/or your care partner have signed paperwork which will be entered into your electronic medical record.  These signatures attest to the fact that that the information above on your After Visit Summary has been reviewed and is understood.  Full responsibility of the confidentiality of this discharge information lies with you and/or your care-partner.  Diverticulosis, high fiber diet, and polyp information given.

## 2016-08-23 NOTE — Progress Notes (Signed)
Report to PACU, RN, vss, BBS= Clear.  

## 2016-08-24 ENCOUNTER — Telehealth: Payer: Self-pay

## 2016-08-24 ENCOUNTER — Telehealth: Payer: Self-pay | Admitting: *Deleted

## 2016-08-24 NOTE — Telephone Encounter (Signed)
Attempted to reach pt. With follow up call following endoscopic procedure yesterday.  LM on pt. Ans. Machine.   Will try to reach pt. Later today.

## 2016-08-24 NOTE — Telephone Encounter (Signed)
Message left

## 2016-08-29 ENCOUNTER — Encounter: Payer: Self-pay | Admitting: Internal Medicine

## 2016-08-30 ENCOUNTER — Ambulatory Visit (INDEPENDENT_AMBULATORY_CARE_PROVIDER_SITE_OTHER): Payer: BLUE CROSS/BLUE SHIELD | Admitting: Psychology

## 2016-08-30 DIAGNOSIS — F331 Major depressive disorder, recurrent, moderate: Secondary | ICD-10-CM | POA: Diagnosis not present

## 2016-09-13 ENCOUNTER — Ambulatory Visit (INDEPENDENT_AMBULATORY_CARE_PROVIDER_SITE_OTHER): Payer: BLUE CROSS/BLUE SHIELD | Admitting: Psychology

## 2016-09-13 DIAGNOSIS — F331 Major depressive disorder, recurrent, moderate: Secondary | ICD-10-CM

## 2016-10-04 ENCOUNTER — Ambulatory Visit (INDEPENDENT_AMBULATORY_CARE_PROVIDER_SITE_OTHER): Payer: BLUE CROSS/BLUE SHIELD | Admitting: Psychology

## 2016-10-04 DIAGNOSIS — F331 Major depressive disorder, recurrent, moderate: Secondary | ICD-10-CM

## 2016-10-18 ENCOUNTER — Ambulatory Visit (INDEPENDENT_AMBULATORY_CARE_PROVIDER_SITE_OTHER): Payer: BLUE CROSS/BLUE SHIELD | Admitting: Psychology

## 2016-10-18 DIAGNOSIS — F331 Major depressive disorder, recurrent, moderate: Secondary | ICD-10-CM | POA: Diagnosis not present

## 2016-11-08 ENCOUNTER — Ambulatory Visit: Payer: BLUE CROSS/BLUE SHIELD | Admitting: Psychology

## 2016-12-11 ENCOUNTER — Ambulatory Visit (INDEPENDENT_AMBULATORY_CARE_PROVIDER_SITE_OTHER): Payer: BLUE CROSS/BLUE SHIELD | Admitting: Psychology

## 2016-12-11 DIAGNOSIS — F4323 Adjustment disorder with mixed anxiety and depressed mood: Secondary | ICD-10-CM

## 2016-12-11 DIAGNOSIS — F902 Attention-deficit hyperactivity disorder, combined type: Secondary | ICD-10-CM

## 2016-12-29 ENCOUNTER — Ambulatory Visit (INDEPENDENT_AMBULATORY_CARE_PROVIDER_SITE_OTHER): Payer: BLUE CROSS/BLUE SHIELD | Admitting: Psychology

## 2016-12-29 DIAGNOSIS — F331 Major depressive disorder, recurrent, moderate: Secondary | ICD-10-CM

## 2017-01-04 DIAGNOSIS — Z1283 Encounter for screening for malignant neoplasm of skin: Secondary | ICD-10-CM | POA: Diagnosis not present

## 2017-01-04 DIAGNOSIS — D485 Neoplasm of uncertain behavior of skin: Secondary | ICD-10-CM | POA: Diagnosis not present

## 2017-01-04 DIAGNOSIS — D225 Melanocytic nevi of trunk: Secondary | ICD-10-CM | POA: Diagnosis not present

## 2017-01-04 DIAGNOSIS — L308 Other specified dermatitis: Secondary | ICD-10-CM | POA: Diagnosis not present

## 2017-01-04 DIAGNOSIS — L905 Scar conditions and fibrosis of skin: Secondary | ICD-10-CM | POA: Diagnosis not present

## 2017-01-23 DIAGNOSIS — F331 Major depressive disorder, recurrent, moderate: Secondary | ICD-10-CM | POA: Diagnosis not present

## 2017-01-23 DIAGNOSIS — F5081 Binge eating disorder: Secondary | ICD-10-CM | POA: Diagnosis not present

## 2017-01-23 DIAGNOSIS — F411 Generalized anxiety disorder: Secondary | ICD-10-CM | POA: Diagnosis not present

## 2017-01-29 DIAGNOSIS — L089 Local infection of the skin and subcutaneous tissue, unspecified: Secondary | ICD-10-CM | POA: Diagnosis not present

## 2017-01-29 DIAGNOSIS — D485 Neoplasm of uncertain behavior of skin: Secondary | ICD-10-CM | POA: Diagnosis not present

## 2017-01-29 DIAGNOSIS — D225 Melanocytic nevi of trunk: Secondary | ICD-10-CM | POA: Diagnosis not present

## 2017-04-23 DIAGNOSIS — F331 Major depressive disorder, recurrent, moderate: Secondary | ICD-10-CM | POA: Diagnosis not present

## 2017-04-23 DIAGNOSIS — F5081 Binge eating disorder: Secondary | ICD-10-CM | POA: Diagnosis not present

## 2017-04-23 DIAGNOSIS — F411 Generalized anxiety disorder: Secondary | ICD-10-CM | POA: Diagnosis not present

## 2017-07-26 DIAGNOSIS — F331 Major depressive disorder, recurrent, moderate: Secondary | ICD-10-CM | POA: Diagnosis not present

## 2017-07-26 DIAGNOSIS — F5081 Binge eating disorder: Secondary | ICD-10-CM | POA: Diagnosis not present

## 2017-07-26 DIAGNOSIS — F411 Generalized anxiety disorder: Secondary | ICD-10-CM | POA: Diagnosis not present

## 2017-09-25 DIAGNOSIS — F331 Major depressive disorder, recurrent, moderate: Secondary | ICD-10-CM | POA: Diagnosis not present

## 2017-10-29 DIAGNOSIS — F5081 Binge eating disorder: Secondary | ICD-10-CM | POA: Diagnosis not present

## 2017-10-29 DIAGNOSIS — F331 Major depressive disorder, recurrent, moderate: Secondary | ICD-10-CM | POA: Diagnosis not present

## 2017-10-29 DIAGNOSIS — F411 Generalized anxiety disorder: Secondary | ICD-10-CM | POA: Diagnosis not present

## 2018-01-01 ENCOUNTER — Other Ambulatory Visit: Payer: Self-pay | Admitting: Family Medicine

## 2018-01-01 DIAGNOSIS — Z1231 Encounter for screening mammogram for malignant neoplasm of breast: Secondary | ICD-10-CM

## 2018-01-17 DIAGNOSIS — D485 Neoplasm of uncertain behavior of skin: Secondary | ICD-10-CM | POA: Diagnosis not present

## 2018-01-17 DIAGNOSIS — Z1283 Encounter for screening for malignant neoplasm of skin: Secondary | ICD-10-CM | POA: Diagnosis not present

## 2018-01-17 DIAGNOSIS — B353 Tinea pedis: Secondary | ICD-10-CM | POA: Diagnosis not present

## 2018-01-17 DIAGNOSIS — D225 Melanocytic nevi of trunk: Secondary | ICD-10-CM | POA: Diagnosis not present

## 2018-02-11 ENCOUNTER — Ambulatory Visit
Admission: RE | Admit: 2018-02-11 | Discharge: 2018-02-11 | Disposition: A | Discharge status: Home / Self Care | Payer: Self-pay | Source: Ambulatory Visit | Attending: Family Medicine | Admitting: Family Medicine

## 2018-02-11 DIAGNOSIS — Z1231 Encounter for screening mammogram for malignant neoplasm of breast: Secondary | ICD-10-CM | POA: Diagnosis not present

## 2018-03-05 ENCOUNTER — Ambulatory Visit: Payer: BLUE CROSS/BLUE SHIELD | Admitting: Psychology

## 2018-04-08 DIAGNOSIS — F5081 Binge eating disorder: Secondary | ICD-10-CM | POA: Diagnosis not present

## 2018-04-08 DIAGNOSIS — F331 Major depressive disorder, recurrent, moderate: Secondary | ICD-10-CM | POA: Diagnosis not present

## 2018-04-08 DIAGNOSIS — F411 Generalized anxiety disorder: Secondary | ICD-10-CM | POA: Diagnosis not present

## 2018-04-25 ENCOUNTER — Ambulatory Visit (INDEPENDENT_AMBULATORY_CARE_PROVIDER_SITE_OTHER): Payer: BLUE CROSS/BLUE SHIELD | Admitting: Psychology

## 2018-04-25 DIAGNOSIS — F331 Major depressive disorder, recurrent, moderate: Secondary | ICD-10-CM | POA: Diagnosis not present

## 2018-04-26 ENCOUNTER — Ambulatory Visit: Payer: BLUE CROSS/BLUE SHIELD | Admitting: Psychology

## 2018-05-14 ENCOUNTER — Ambulatory Visit (INDEPENDENT_AMBULATORY_CARE_PROVIDER_SITE_OTHER): Payer: BLUE CROSS/BLUE SHIELD | Admitting: Psychology

## 2018-05-14 DIAGNOSIS — F331 Major depressive disorder, recurrent, moderate: Secondary | ICD-10-CM | POA: Diagnosis not present

## 2018-05-14 DIAGNOSIS — F4323 Adjustment disorder with mixed anxiety and depressed mood: Secondary | ICD-10-CM

## 2018-05-20 ENCOUNTER — Ambulatory Visit: Payer: BLUE CROSS/BLUE SHIELD | Admitting: Psychology

## 2018-05-21 ENCOUNTER — Ambulatory Visit (INDEPENDENT_AMBULATORY_CARE_PROVIDER_SITE_OTHER): Payer: BLUE CROSS/BLUE SHIELD | Admitting: Psychology

## 2018-05-21 DIAGNOSIS — F4323 Adjustment disorder with mixed anxiety and depressed mood: Secondary | ICD-10-CM | POA: Diagnosis not present

## 2018-05-28 ENCOUNTER — Ambulatory Visit (INDEPENDENT_AMBULATORY_CARE_PROVIDER_SITE_OTHER): Payer: BLUE CROSS/BLUE SHIELD | Admitting: Psychology

## 2018-05-28 DIAGNOSIS — F4323 Adjustment disorder with mixed anxiety and depressed mood: Secondary | ICD-10-CM

## 2018-07-02 ENCOUNTER — Ambulatory Visit (INDEPENDENT_AMBULATORY_CARE_PROVIDER_SITE_OTHER): Payer: BLUE CROSS/BLUE SHIELD | Admitting: Psychology

## 2018-07-02 DIAGNOSIS — F331 Major depressive disorder, recurrent, moderate: Secondary | ICD-10-CM | POA: Diagnosis not present

## 2018-07-11 ENCOUNTER — Ambulatory Visit (INDEPENDENT_AMBULATORY_CARE_PROVIDER_SITE_OTHER): Payer: BLUE CROSS/BLUE SHIELD | Admitting: Psychology

## 2018-07-11 DIAGNOSIS — F331 Major depressive disorder, recurrent, moderate: Secondary | ICD-10-CM | POA: Diagnosis not present

## 2018-07-17 ENCOUNTER — Ambulatory Visit (INDEPENDENT_AMBULATORY_CARE_PROVIDER_SITE_OTHER): Payer: BLUE CROSS/BLUE SHIELD | Admitting: Psychology

## 2018-07-17 DIAGNOSIS — F331 Major depressive disorder, recurrent, moderate: Secondary | ICD-10-CM

## 2018-07-18 DIAGNOSIS — F4323 Adjustment disorder with mixed anxiety and depressed mood: Secondary | ICD-10-CM | POA: Diagnosis not present

## 2018-07-18 DIAGNOSIS — F5081 Binge eating disorder: Secondary | ICD-10-CM | POA: Diagnosis not present

## 2018-07-18 DIAGNOSIS — F411 Generalized anxiety disorder: Secondary | ICD-10-CM | POA: Diagnosis not present

## 2018-07-18 DIAGNOSIS — F331 Major depressive disorder, recurrent, moderate: Secondary | ICD-10-CM | POA: Diagnosis not present

## 2018-07-25 ENCOUNTER — Ambulatory Visit (INDEPENDENT_AMBULATORY_CARE_PROVIDER_SITE_OTHER): Payer: BLUE CROSS/BLUE SHIELD | Admitting: Psychology

## 2018-07-25 DIAGNOSIS — F331 Major depressive disorder, recurrent, moderate: Secondary | ICD-10-CM

## 2018-07-31 ENCOUNTER — Ambulatory Visit (INDEPENDENT_AMBULATORY_CARE_PROVIDER_SITE_OTHER): Payer: BLUE CROSS/BLUE SHIELD | Admitting: Psychology

## 2018-07-31 DIAGNOSIS — F331 Major depressive disorder, recurrent, moderate: Secondary | ICD-10-CM

## 2018-08-08 ENCOUNTER — Ambulatory Visit (INDEPENDENT_AMBULATORY_CARE_PROVIDER_SITE_OTHER): Payer: BLUE CROSS/BLUE SHIELD | Admitting: Psychology

## 2018-08-08 DIAGNOSIS — F4323 Adjustment disorder with mixed anxiety and depressed mood: Secondary | ICD-10-CM

## 2018-08-08 DIAGNOSIS — F331 Major depressive disorder, recurrent, moderate: Secondary | ICD-10-CM

## 2018-08-15 ENCOUNTER — Ambulatory Visit (INDEPENDENT_AMBULATORY_CARE_PROVIDER_SITE_OTHER): Payer: BLUE CROSS/BLUE SHIELD | Admitting: Psychology

## 2018-08-15 DIAGNOSIS — F331 Major depressive disorder, recurrent, moderate: Secondary | ICD-10-CM | POA: Diagnosis not present

## 2018-08-28 ENCOUNTER — Ambulatory Visit: Payer: BLUE CROSS/BLUE SHIELD | Admitting: Psychology

## 2018-09-02 ENCOUNTER — Ambulatory Visit (INDEPENDENT_AMBULATORY_CARE_PROVIDER_SITE_OTHER): Payer: BC Managed Care – PPO | Admitting: Psychology

## 2018-09-02 DIAGNOSIS — F331 Major depressive disorder, recurrent, moderate: Secondary | ICD-10-CM | POA: Diagnosis not present

## 2018-09-12 ENCOUNTER — Ambulatory Visit (INDEPENDENT_AMBULATORY_CARE_PROVIDER_SITE_OTHER): Payer: BC Managed Care – PPO | Admitting: Psychology

## 2018-09-12 DIAGNOSIS — F4323 Adjustment disorder with mixed anxiety and depressed mood: Secondary | ICD-10-CM

## 2018-09-12 DIAGNOSIS — F331 Major depressive disorder, recurrent, moderate: Secondary | ICD-10-CM

## 2018-09-19 ENCOUNTER — Ambulatory Visit (INDEPENDENT_AMBULATORY_CARE_PROVIDER_SITE_OTHER): Payer: BC Managed Care – PPO | Admitting: Psychology

## 2018-09-19 DIAGNOSIS — F331 Major depressive disorder, recurrent, moderate: Secondary | ICD-10-CM | POA: Diagnosis not present

## 2018-09-30 ENCOUNTER — Ambulatory Visit (INDEPENDENT_AMBULATORY_CARE_PROVIDER_SITE_OTHER): Payer: BC Managed Care – PPO | Admitting: Psychology

## 2018-09-30 DIAGNOSIS — F5081 Binge eating disorder: Secondary | ICD-10-CM | POA: Diagnosis not present

## 2018-09-30 DIAGNOSIS — F331 Major depressive disorder, recurrent, moderate: Secondary | ICD-10-CM | POA: Diagnosis not present

## 2018-09-30 DIAGNOSIS — F411 Generalized anxiety disorder: Secondary | ICD-10-CM | POA: Diagnosis not present

## 2018-09-30 DIAGNOSIS — F4323 Adjustment disorder with mixed anxiety and depressed mood: Secondary | ICD-10-CM | POA: Diagnosis not present

## 2018-10-09 ENCOUNTER — Ambulatory Visit (INDEPENDENT_AMBULATORY_CARE_PROVIDER_SITE_OTHER): Payer: BC Managed Care – PPO | Admitting: Psychology

## 2018-10-09 DIAGNOSIS — F331 Major depressive disorder, recurrent, moderate: Secondary | ICD-10-CM | POA: Diagnosis not present

## 2018-10-16 ENCOUNTER — Ambulatory Visit (INDEPENDENT_AMBULATORY_CARE_PROVIDER_SITE_OTHER): Payer: BC Managed Care – PPO | Admitting: Psychology

## 2018-10-16 DIAGNOSIS — F331 Major depressive disorder, recurrent, moderate: Secondary | ICD-10-CM

## 2018-11-06 ENCOUNTER — Ambulatory Visit (INDEPENDENT_AMBULATORY_CARE_PROVIDER_SITE_OTHER): Payer: BC Managed Care – PPO | Admitting: Psychology

## 2018-11-06 DIAGNOSIS — F4323 Adjustment disorder with mixed anxiety and depressed mood: Secondary | ICD-10-CM | POA: Diagnosis not present

## 2018-11-21 ENCOUNTER — Ambulatory Visit (INDEPENDENT_AMBULATORY_CARE_PROVIDER_SITE_OTHER): Payer: BC Managed Care – PPO | Admitting: Psychology

## 2018-11-21 DIAGNOSIS — F4323 Adjustment disorder with mixed anxiety and depressed mood: Secondary | ICD-10-CM

## 2018-11-21 DIAGNOSIS — F3110 Bipolar disorder, current episode manic without psychotic features, unspecified: Secondary | ICD-10-CM | POA: Diagnosis not present

## 2018-12-04 ENCOUNTER — Ambulatory Visit (INDEPENDENT_AMBULATORY_CARE_PROVIDER_SITE_OTHER): Payer: BC Managed Care – PPO | Admitting: Psychology

## 2018-12-04 DIAGNOSIS — F3110 Bipolar disorder, current episode manic without psychotic features, unspecified: Secondary | ICD-10-CM | POA: Diagnosis not present

## 2018-12-16 DIAGNOSIS — J309 Allergic rhinitis, unspecified: Secondary | ICD-10-CM | POA: Diagnosis not present

## 2018-12-16 DIAGNOSIS — J45909 Unspecified asthma, uncomplicated: Secondary | ICD-10-CM | POA: Diagnosis not present

## 2018-12-19 ENCOUNTER — Ambulatory Visit (INDEPENDENT_AMBULATORY_CARE_PROVIDER_SITE_OTHER): Payer: BC Managed Care – PPO | Admitting: Psychology

## 2018-12-19 DIAGNOSIS — F321 Major depressive disorder, single episode, moderate: Secondary | ICD-10-CM

## 2018-12-31 ENCOUNTER — Ambulatory Visit (INDEPENDENT_AMBULATORY_CARE_PROVIDER_SITE_OTHER): Payer: BC Managed Care – PPO | Admitting: Psychology

## 2018-12-31 DIAGNOSIS — F331 Major depressive disorder, recurrent, moderate: Secondary | ICD-10-CM

## 2019-01-14 ENCOUNTER — Ambulatory Visit (INDEPENDENT_AMBULATORY_CARE_PROVIDER_SITE_OTHER): Payer: BC Managed Care – PPO | Admitting: Psychology

## 2019-01-14 DIAGNOSIS — F331 Major depressive disorder, recurrent, moderate: Secondary | ICD-10-CM

## 2019-01-17 DIAGNOSIS — F331 Major depressive disorder, recurrent, moderate: Secondary | ICD-10-CM | POA: Diagnosis not present

## 2019-01-17 DIAGNOSIS — F4323 Adjustment disorder with mixed anxiety and depressed mood: Secondary | ICD-10-CM | POA: Diagnosis not present

## 2019-01-17 DIAGNOSIS — F411 Generalized anxiety disorder: Secondary | ICD-10-CM | POA: Diagnosis not present

## 2019-01-17 DIAGNOSIS — F5081 Binge eating disorder: Secondary | ICD-10-CM | POA: Diagnosis not present

## 2019-01-29 ENCOUNTER — Ambulatory Visit (INDEPENDENT_AMBULATORY_CARE_PROVIDER_SITE_OTHER): Payer: BC Managed Care – PPO | Admitting: Psychology

## 2019-01-29 DIAGNOSIS — F331 Major depressive disorder, recurrent, moderate: Secondary | ICD-10-CM

## 2019-01-29 DIAGNOSIS — F4323 Adjustment disorder with mixed anxiety and depressed mood: Secondary | ICD-10-CM

## 2019-02-11 ENCOUNTER — Ambulatory Visit (INDEPENDENT_AMBULATORY_CARE_PROVIDER_SITE_OTHER): Payer: BC Managed Care – PPO | Admitting: Psychology

## 2019-02-11 DIAGNOSIS — F331 Major depressive disorder, recurrent, moderate: Secondary | ICD-10-CM

## 2019-02-19 ENCOUNTER — Other Ambulatory Visit: Payer: Self-pay | Admitting: Family Medicine

## 2019-02-19 DIAGNOSIS — Z1231 Encounter for screening mammogram for malignant neoplasm of breast: Secondary | ICD-10-CM

## 2019-03-03 ENCOUNTER — Ambulatory Visit (INDEPENDENT_AMBULATORY_CARE_PROVIDER_SITE_OTHER): Payer: BC Managed Care – PPO | Admitting: Psychology

## 2019-03-03 DIAGNOSIS — F331 Major depressive disorder, recurrent, moderate: Secondary | ICD-10-CM

## 2019-03-05 ENCOUNTER — Other Ambulatory Visit (HOSPITAL_COMMUNITY)
Admission: RE | Admit: 2019-03-05 | Discharge: 2019-03-05 | Disposition: A | Discharge status: Home / Self Care | Payer: BC Managed Care – PPO | Source: Ambulatory Visit | Attending: Family Medicine | Admitting: Family Medicine

## 2019-03-05 DIAGNOSIS — Z01411 Encounter for gynecological examination (general) (routine) with abnormal findings: Secondary | ICD-10-CM | POA: Insufficient documentation

## 2019-03-05 DIAGNOSIS — E785 Hyperlipidemia, unspecified: Secondary | ICD-10-CM | POA: Diagnosis not present

## 2019-03-05 DIAGNOSIS — Z23 Encounter for immunization: Secondary | ICD-10-CM | POA: Diagnosis not present

## 2019-03-05 DIAGNOSIS — Z0000 Encounter for general adult medical examination without abnormal findings: Secondary | ICD-10-CM | POA: Diagnosis not present

## 2019-03-06 ENCOUNTER — Other Ambulatory Visit: Payer: Self-pay | Admitting: Family Medicine

## 2019-03-10 LAB — CYTOLOGY - PAP
Comment: NEGATIVE
Diagnosis: NEGATIVE
High risk HPV: NEGATIVE

## 2019-04-16 ENCOUNTER — Ambulatory Visit: Payer: Self-pay

## 2019-04-17 DIAGNOSIS — F5081 Binge eating disorder: Secondary | ICD-10-CM | POA: Diagnosis not present

## 2019-04-17 DIAGNOSIS — F411 Generalized anxiety disorder: Secondary | ICD-10-CM | POA: Diagnosis not present

## 2019-04-17 DIAGNOSIS — F331 Major depressive disorder, recurrent, moderate: Secondary | ICD-10-CM | POA: Diagnosis not present

## 2019-06-02 ENCOUNTER — Ambulatory Visit (INDEPENDENT_AMBULATORY_CARE_PROVIDER_SITE_OTHER): Payer: BC Managed Care – PPO | Admitting: Psychology

## 2019-06-02 DIAGNOSIS — F331 Major depressive disorder, recurrent, moderate: Secondary | ICD-10-CM | POA: Diagnosis not present

## 2019-06-23 ENCOUNTER — Ambulatory Visit (INDEPENDENT_AMBULATORY_CARE_PROVIDER_SITE_OTHER): Payer: BC Managed Care – PPO | Admitting: Psychology

## 2019-06-23 DIAGNOSIS — F4323 Adjustment disorder with mixed anxiety and depressed mood: Secondary | ICD-10-CM | POA: Diagnosis not present

## 2019-06-23 DIAGNOSIS — F331 Major depressive disorder, recurrent, moderate: Secondary | ICD-10-CM | POA: Diagnosis not present

## 2019-07-17 DIAGNOSIS — F331 Major depressive disorder, recurrent, moderate: Secondary | ICD-10-CM | POA: Diagnosis not present

## 2019-07-17 DIAGNOSIS — F411 Generalized anxiety disorder: Secondary | ICD-10-CM | POA: Diagnosis not present

## 2019-07-17 DIAGNOSIS — F4323 Adjustment disorder with mixed anxiety and depressed mood: Secondary | ICD-10-CM | POA: Diagnosis not present

## 2019-07-17 DIAGNOSIS — F5081 Binge eating disorder: Secondary | ICD-10-CM | POA: Diagnosis not present

## 2019-08-18 ENCOUNTER — Ambulatory Visit (INDEPENDENT_AMBULATORY_CARE_PROVIDER_SITE_OTHER): Payer: BC Managed Care – PPO | Admitting: Psychology

## 2019-08-18 DIAGNOSIS — F331 Major depressive disorder, recurrent, moderate: Secondary | ICD-10-CM

## 2019-09-08 ENCOUNTER — Ambulatory Visit (INDEPENDENT_AMBULATORY_CARE_PROVIDER_SITE_OTHER): Payer: BC Managed Care – PPO | Admitting: Psychology

## 2019-09-08 DIAGNOSIS — F331 Major depressive disorder, recurrent, moderate: Secondary | ICD-10-CM

## 2019-10-06 ENCOUNTER — Ambulatory Visit (INDEPENDENT_AMBULATORY_CARE_PROVIDER_SITE_OTHER): Payer: BC Managed Care – PPO | Admitting: Psychology

## 2019-10-06 DIAGNOSIS — F331 Major depressive disorder, recurrent, moderate: Secondary | ICD-10-CM | POA: Diagnosis not present

## 2019-10-27 ENCOUNTER — Ambulatory Visit (INDEPENDENT_AMBULATORY_CARE_PROVIDER_SITE_OTHER): Payer: BC Managed Care – PPO | Admitting: Psychology

## 2019-10-27 DIAGNOSIS — F331 Major depressive disorder, recurrent, moderate: Secondary | ICD-10-CM | POA: Diagnosis not present

## 2019-11-03 DIAGNOSIS — F4323 Adjustment disorder with mixed anxiety and depressed mood: Secondary | ICD-10-CM | POA: Diagnosis not present

## 2019-11-03 DIAGNOSIS — F331 Major depressive disorder, recurrent, moderate: Secondary | ICD-10-CM | POA: Diagnosis not present

## 2019-11-03 DIAGNOSIS — F5081 Binge eating disorder: Secondary | ICD-10-CM | POA: Diagnosis not present

## 2019-11-03 DIAGNOSIS — F411 Generalized anxiety disorder: Secondary | ICD-10-CM | POA: Diagnosis not present

## 2019-11-10 ENCOUNTER — Ambulatory Visit: Payer: BC Managed Care – PPO | Admitting: Psychology

## 2019-11-10 ENCOUNTER — Ambulatory Visit (INDEPENDENT_AMBULATORY_CARE_PROVIDER_SITE_OTHER): Payer: BC Managed Care – PPO | Admitting: Psychology

## 2019-11-10 DIAGNOSIS — F3110 Bipolar disorder, current episode manic without psychotic features, unspecified: Secondary | ICD-10-CM | POA: Diagnosis not present

## 2019-11-25 ENCOUNTER — Ambulatory Visit (INDEPENDENT_AMBULATORY_CARE_PROVIDER_SITE_OTHER): Payer: BC Managed Care – PPO | Admitting: Psychology

## 2019-11-25 DIAGNOSIS — F331 Major depressive disorder, recurrent, moderate: Secondary | ICD-10-CM | POA: Diagnosis not present

## 2019-12-04 DIAGNOSIS — F411 Generalized anxiety disorder: Secondary | ICD-10-CM | POA: Diagnosis not present

## 2019-12-04 DIAGNOSIS — F5081 Binge eating disorder: Secondary | ICD-10-CM | POA: Diagnosis not present

## 2019-12-04 DIAGNOSIS — F331 Major depressive disorder, recurrent, moderate: Secondary | ICD-10-CM | POA: Diagnosis not present

## 2019-12-04 DIAGNOSIS — F4323 Adjustment disorder with mixed anxiety and depressed mood: Secondary | ICD-10-CM | POA: Diagnosis not present

## 2019-12-06 DIAGNOSIS — Z20822 Contact with and (suspected) exposure to covid-19: Secondary | ICD-10-CM | POA: Diagnosis not present

## 2019-12-10 ENCOUNTER — Ambulatory Visit (INDEPENDENT_AMBULATORY_CARE_PROVIDER_SITE_OTHER): Payer: BC Managed Care – PPO | Admitting: Psychology

## 2019-12-10 DIAGNOSIS — F3110 Bipolar disorder, current episode manic without psychotic features, unspecified: Secondary | ICD-10-CM

## 2019-12-23 ENCOUNTER — Ambulatory Visit (INDEPENDENT_AMBULATORY_CARE_PROVIDER_SITE_OTHER): Payer: BC Managed Care – PPO | Admitting: Psychology

## 2019-12-23 DIAGNOSIS — F4323 Adjustment disorder with mixed anxiety and depressed mood: Secondary | ICD-10-CM | POA: Diagnosis not present

## 2019-12-23 DIAGNOSIS — F331 Major depressive disorder, recurrent, moderate: Secondary | ICD-10-CM | POA: Diagnosis not present

## 2020-01-13 ENCOUNTER — Ambulatory Visit (INDEPENDENT_AMBULATORY_CARE_PROVIDER_SITE_OTHER): Payer: BC Managed Care – PPO | Admitting: Psychology

## 2020-01-13 DIAGNOSIS — F331 Major depressive disorder, recurrent, moderate: Secondary | ICD-10-CM | POA: Diagnosis not present

## 2020-01-27 ENCOUNTER — Ambulatory Visit (INDEPENDENT_AMBULATORY_CARE_PROVIDER_SITE_OTHER): Payer: BC Managed Care – PPO | Admitting: Psychology

## 2020-01-27 DIAGNOSIS — F4323 Adjustment disorder with mixed anxiety and depressed mood: Secondary | ICD-10-CM | POA: Diagnosis not present

## 2020-01-27 DIAGNOSIS — F331 Major depressive disorder, recurrent, moderate: Secondary | ICD-10-CM | POA: Diagnosis not present

## 2020-02-16 DIAGNOSIS — R059 Cough, unspecified: Secondary | ICD-10-CM | POA: Diagnosis not present

## 2020-05-04 ENCOUNTER — Other Ambulatory Visit: Payer: Self-pay | Admitting: *Deleted

## 2020-05-04 DIAGNOSIS — Z1231 Encounter for screening mammogram for malignant neoplasm of breast: Secondary | ICD-10-CM

## 2020-12-23 ENCOUNTER — Ambulatory Visit (INDEPENDENT_AMBULATORY_CARE_PROVIDER_SITE_OTHER): Payer: 59 | Admitting: Psychology

## 2020-12-23 DIAGNOSIS — F331 Major depressive disorder, recurrent, moderate: Secondary | ICD-10-CM

## 2021-01-05 ENCOUNTER — Ambulatory Visit (INDEPENDENT_AMBULATORY_CARE_PROVIDER_SITE_OTHER): Payer: 59 | Admitting: Psychology

## 2021-01-05 DIAGNOSIS — F331 Major depressive disorder, recurrent, moderate: Secondary | ICD-10-CM

## 2021-01-06 ENCOUNTER — Ambulatory Visit: Payer: 59 | Admitting: Psychology

## 2021-01-10 ENCOUNTER — Ambulatory Visit (INDEPENDENT_AMBULATORY_CARE_PROVIDER_SITE_OTHER): Payer: 59 | Admitting: Psychology

## 2021-01-10 DIAGNOSIS — F331 Major depressive disorder, recurrent, moderate: Secondary | ICD-10-CM

## 2021-01-20 ENCOUNTER — Ambulatory Visit: Payer: 59 | Admitting: Psychology

## 2021-01-27 ENCOUNTER — Ambulatory Visit (INDEPENDENT_AMBULATORY_CARE_PROVIDER_SITE_OTHER): Payer: 59 | Admitting: Psychology

## 2021-01-27 DIAGNOSIS — F331 Major depressive disorder, recurrent, moderate: Secondary | ICD-10-CM

## 2021-02-07 ENCOUNTER — Ambulatory Visit (INDEPENDENT_AMBULATORY_CARE_PROVIDER_SITE_OTHER): Payer: 59 | Admitting: Psychology

## 2021-02-07 DIAGNOSIS — F331 Major depressive disorder, recurrent, moderate: Secondary | ICD-10-CM | POA: Diagnosis not present

## 2021-02-24 ENCOUNTER — Ambulatory Visit (INDEPENDENT_AMBULATORY_CARE_PROVIDER_SITE_OTHER): Payer: 59 | Admitting: Psychology

## 2021-02-24 DIAGNOSIS — F330 Major depressive disorder, recurrent, mild: Secondary | ICD-10-CM

## 2021-03-07 ENCOUNTER — Ambulatory Visit: Payer: 59 | Admitting: Psychology

## 2021-03-27 HISTORY — PX: BREAST BIOPSY: SHX20

## 2021-03-29 ENCOUNTER — Other Ambulatory Visit: Payer: Self-pay | Admitting: Family Medicine

## 2021-03-29 DIAGNOSIS — Z1231 Encounter for screening mammogram for malignant neoplasm of breast: Secondary | ICD-10-CM

## 2021-03-30 ENCOUNTER — Ambulatory Visit (INDEPENDENT_AMBULATORY_CARE_PROVIDER_SITE_OTHER): Payer: 59 | Admitting: Psychology

## 2021-03-30 DIAGNOSIS — F331 Major depressive disorder, recurrent, moderate: Secondary | ICD-10-CM | POA: Diagnosis not present

## 2021-03-30 NOTE — Progress Notes (Signed)
Progress Note Start: 02/24/2021 10:00 AM End: 02/24/2021 10:55 AM Diagnosis:   F 34.1 Major Depressive Disorder, recurrent, moderate   F 42.1 Generalized Anxiety Disorder * r/o Bipolar II Disorder w/ anxious distress Symptoms Depressed or irritable mood. (Status: maintained) -- No Description Entered  Difficulty in saying no to others; assumes not being liked by others. (Status: maintained) -- No Description Entered  Fear of rejection by others, especially peer group. (Status: maintained) -- No Description Entered  Feelings of hopelessness, worthlessness, or inappropriate guilt. (Status: maintained) -- No Description Entered  History of chronic or recurrent depression for which the client has taken antidepressant medication, been hospitalized, had outpatient treatment, or had a course of electroconvulsive therapy. (Status: maintained) -- No Description Entered  Lack of energy. (Status: maintained) -- No Description Entered  Makes self-disparaging remarks; sees self as unattractive, worthless, a loser, a burden, unimportant; takes blame easily. (Status: maintained) -- No Description Entered  Sleeplessness or hypersomnia. (Status: maintained) -- No Description Entered  Unresolved grief issues. (Status: maintained) -- No Description Entered  Medication Status compliance  Safety none  If Suicidal or Homicidal State Action Taken: unspecified  Current Risk: low Medications unspecified Objectives Related Problem: Develop healthy interpersonal relationships that lead to the alleviation and help prevent the relapse of depression. Description: Learn and implement behavioral strategies to overcome depression. Target Date: 2022-01-05 Frequency: Biweekly Modality: individual Progress: 0% Planned Intervention: Engage the client in "behavioral activation," increasing his/her activity level and contact with sources of reward, while identifying processes that inhibit activation (see Behavioral  Activation for Depression by Beverly Gust, Dimidjian, and Herman-Dunn; or assign "Identify and Schedule Pleasant Activities" in the Adult Psychotherapy Homework Planner by [facility]); use behavioral techniques such as instruction, rehearsal, role-playing, role reversal, as needed, to facilitate activity in the client's daily life; reinforce success.  Related Problem: Develop healthy interpersonal relationships that lead to the alleviation and help prevent the relapse of depression. Description: Identify and replace thoughts and beliefs that support depression. Target Date: 2022-01-05 Frequency: Biweekly Modality: individual Progress: 0%  Related Problem: Develop healthy interpersonal relationships that lead to the alleviation and help prevent the relapse of depression. Description: Verbalize an understanding and resolution of current interpersonal problems. Target Date: 2022-01-05 Frequency: Biweekly Modality: individual Progress: 0%  Related Problem: Develop healthy interpersonal relationships that lead to the alleviation and help prevent the relapse of depression. Description: Learn and implement problem-solving and decision-making skills. Target Date: 2022-01-05 Frequency: Biweekly Modality: individual Progress: 0%  Related Problem: Develop healthy interpersonal relationships that lead to the alleviation and help prevent the relapse of depression. Description: Learn and implement conflict resolution skills to resolve interpersonal problems. Target Date: 2022-01-05 Frequency: Biweekly Modality: individual Progress: 0%  Related Problem: Develop healthy interpersonal relationships that lead to the alleviation and help prevent the relapse of depression. Description: Verbalize insight into how past relationships may be influencing current experiences with depression. Target Date: 2022-01-05 Frequency: Biweekly Modality: individual Progress: 0%  Related Problem: Develop healthy  interpersonal relationships that lead to the alleviation and help prevent the relapse of depression. Description: Increasingly verbalize hopeful and positive statements regarding self, others, and the future. Target Date: 2022-01-05 Frequency: Biweekly Modality: individual Progress: 0%  Related Problem: Establish an inward sense of self-worth, confidence, and competence. Description: Increase the frequency of assertive behaviors. Target Date: 2022-01-05 Frequency: Biweekly Modality: individual Progress: 0%  Related Problem: Establish an inward sense of self-worth, confidence, and competence. Description: Increase insight into the historical and current sources of low self-esteem. Target Date:  2022-01-05 Frequency: Biweekly Modality: individual Progress: 0%  Related Problem: Establish an inward sense of self-worth, confidence, and competence. Description: Form realistic, appropriate, and attainable goals for self in all areas of life. Target Date: 2022-01-05 Frequency: Biweekly Modality: individual Progress: 0%  Related Problem: Establish an inward sense of self-worth, confidence, and competence. Description: Decrease the frequency of negative self-descriptive statements and increase frequency of positive self-descriptive statements. Target Date: 2022-01-05 Frequency: Biweekly Modality: individual Progress: 0%  Related Problem: Establish an inward sense of self-worth, confidence, and competence. Description: Identify and replace negative self-talk messages used to reinforce low self-esteem. Target Date: 2022-01-05 Frequency: Biweekly Modality: individual Progress: 0% Planned Intervention: Help the client identify his/her distorted, negative beliefs about self and the world and replace these messages with more realistic, affirmative messages (or assign "Journal and Replace Self-Defeating Thoughts" in the Adult Psychotherapy Homework Planner by [facility] or read What to Say When  You Talk to Yourself by Helmstetter).  Related Problem: Establish an inward sense of self-worth, confidence, and competence. Description: Decrease the verbalized fear of rejection while increasing statements of self-acceptance. Target Date: 2022-01-05 Frequency: Biweekly Modality: individual Progress: 0%  Related Problem: Establish an inward sense of self-worth, confidence, and competence. Description: Identify and engage in activities that would improve self-image by being consistent with one's values. Target Date: 2022-01-05 Frequency: Biweekly Modality: individual Progress: 0%  Related Problem: Establish an inward sense of self-worth, confidence, and competence. Description: Identify positive traits and talents about self. Target Date: 2022-01-05 Frequency: Biweekly Modality: individual Progress: 0%  Related Problem: Establish an inward sense of self-worth, confidence, and competence. Description: Articulate a plan to be proactive in trying to get identified needs met. Target Date: 2022-01-05 Frequency: Biweekly Modality: individual Progress: 0%  Client Response full compliance  Service Location Location, [address] [place], [place] [address]  Service Code cpt [phone]  Assess/facilitate readiness to change  Self care activities  Lifestyle change (exercise, nutrition)  Identify automatic thoughts  Rationally challenge thoughts or beliefs/cognitive restructuring  Validate/empathize  Identify/label emotions  Clarify interpersonal incident (IPT)  Motivational Interviewing  Comments  Today I met with Ebony Courier ([place]) in remote video (WebEx) face to face individual psychotherapy as an accommodation during the [place] pandemic.   Distance Site: Client's Home  Originating Site: Dr. Jannifer Franklin Remote Office  Consent: Obtained verbal consent to transmit session remotely    Flornce reports that Christmas break was difficult.  She and the old boyfriend who have been  talking for years broke up.  After further inquiry, it seems she sent a message that said "it was over" but was really "fishing" for him to chase her.  In the end her manipulation backfired and then she was surprised.  She admitted that on Christmas she "obsessed over death and suicide."  The day after a friend came to see her and she was better.  Herman states she currently was not have S/I or a plan.  In session, I helped her to p/ what occurred, her behavior and its consequences.     Home Practice: make list of all the people in her life - from a positive mind set   Progress: no progress since intake   Royetta Crochet, Ph.D.

## 2021-04-13 ENCOUNTER — Ambulatory Visit: Payer: 59 | Admitting: Psychology

## 2021-04-26 ENCOUNTER — Ambulatory Visit
Admission: RE | Admit: 2021-04-26 | Discharge: 2021-04-26 | Disposition: A | Discharge status: Home / Self Care | Payer: 59 | Source: Ambulatory Visit | Attending: Family Medicine | Admitting: Family Medicine

## 2021-04-26 DIAGNOSIS — Z1231 Encounter for screening mammogram for malignant neoplasm of breast: Secondary | ICD-10-CM

## 2021-04-27 ENCOUNTER — Other Ambulatory Visit: Payer: Self-pay | Admitting: Family Medicine

## 2021-04-27 DIAGNOSIS — R928 Other abnormal and inconclusive findings on diagnostic imaging of breast: Secondary | ICD-10-CM

## 2021-04-27 DIAGNOSIS — N6459 Other signs and symptoms in breast: Secondary | ICD-10-CM

## 2021-04-28 ENCOUNTER — Ambulatory Visit: Payer: 59 | Admitting: Psychology

## 2021-05-02 ENCOUNTER — Ambulatory Visit
Admission: RE | Admit: 2021-05-02 | Discharge: 2021-05-02 | Disposition: A | Discharge status: Home / Self Care | Payer: 59 | Source: Ambulatory Visit | Attending: Family Medicine | Admitting: Family Medicine

## 2021-05-02 ENCOUNTER — Other Ambulatory Visit: Payer: Self-pay | Admitting: Family Medicine

## 2021-05-02 DIAGNOSIS — R928 Other abnormal and inconclusive findings on diagnostic imaging of breast: Secondary | ICD-10-CM

## 2021-05-02 DIAGNOSIS — N6489 Other specified disorders of breast: Secondary | ICD-10-CM

## 2021-05-12 ENCOUNTER — Ambulatory Visit
Admission: RE | Admit: 2021-05-12 | Discharge: 2021-05-12 | Disposition: A | Discharge status: Home / Self Care | Payer: 59 | Source: Ambulatory Visit | Attending: Family Medicine | Admitting: Family Medicine

## 2021-05-12 DIAGNOSIS — N6489 Other specified disorders of breast: Secondary | ICD-10-CM

## 2021-06-06 ENCOUNTER — Ambulatory Visit: Payer: Self-pay | Admitting: Surgery

## 2021-06-06 DIAGNOSIS — N6311 Unspecified lump in the right breast, upper outer quadrant: Secondary | ICD-10-CM

## 2021-06-16 ENCOUNTER — Institutional Professional Consult (permissible substitution): Payer: 59 | Admitting: Plastic Surgery

## 2021-06-16 ENCOUNTER — Ambulatory Visit: Payer: 59 | Admitting: Plastic Surgery

## 2021-06-16 ENCOUNTER — Other Ambulatory Visit: Payer: Self-pay

## 2021-06-16 ENCOUNTER — Encounter: Payer: Self-pay | Admitting: Plastic Surgery

## 2021-06-16 VITALS — BP 156/85 | HR 96 | Ht 62.0 in | Wt 201.0 lb

## 2021-06-16 DIAGNOSIS — N6489 Other specified disorders of breast: Secondary | ICD-10-CM

## 2021-06-16 DIAGNOSIS — T8543XA Leakage of breast prosthesis and implant, initial encounter: Secondary | ICD-10-CM | POA: Diagnosis not present

## 2021-06-16 NOTE — Progress Notes (Signed)
? ?Referring Provider ?No referring provider defined for this encounter.  ? ?CC:  ?Chief Complaint  ?Patient presents with  ? Consult  ?   ? ?Ebony Herman is an 61 y.o. female.  ?HPI: Patient presents to discuss breast surgery.  A lesion was identified on recent mammography and was biopsied with concern for a radial scar.  Pathology report read as Apocrine metaplasia and microcalcifications.  She has seen Dr. Brantley Stage and is planning seed localized lumpectomy.  She has a history of breast augmentation performed in 2006.  She is unsure of the size and manufacture of her saline implants.  She has noted a deflation in the right breast implant which was confirmed on mammography.  She is interested in removal of her breast implants and replacement of new implants along with a lift and a areolar reduction at the time of her lumpectomy.  She would like to be about the same volume if not a little bit smaller.  She does not smoke and is not a diabetic.  Implants are subpectoral according to her. ? ?Allergies  ?Allergen Reactions  ? Flonase [Fluticasone Propionate] Other (See Comments)  ?  bleeding  ? Penicillins Hives  ?  itch  ? ? ?Outpatient Encounter Medications as of 06/16/2021  ?Medication Sig  ? albuterol (PROVENTIL HFA;VENTOLIN HFA) 108 (90 BASE) MCG/ACT inhaler Inhale into the lungs every 6 (six) hours as needed for wheezing or shortness of breath.  ? alprazolam (XANAX) 2 MG tablet Take 2 mg by mouth daily as needed.  ? APLENZIN 348 MG TB24 Take 1 tablet by mouth daily.  ? doxepin (SINEQUAN) 25 MG capsule Take 1 capsule by mouth daily.  ? Fluticasone-Salmeterol (ADVAIR) 100-50 MCG/DOSE AEPB Inhale 1 puff into the lungs 2 (two) times daily.  ? lamoTRIgine (LAMICTAL) 25 MG tablet Take 50 mg by mouth 2 (two) times daily.  ? lisdexamfetamine (VYVANSE) 50 MG capsule Take 50 mg by mouth daily.  ? mometasone (NASONEX) 50 MCG/ACT nasal spray Place 2 sprays into the nose as needed.  ? oxymetazoline (AFRIN) 0.05 % nasal  spray Place 1 spray into both nostrils 2 (two) times daily as needed for congestion.  ? VRAYLAR 3 MG capsule Take 3 mg by mouth daily.  ? zolpidem (AMBIEN) 10 MG tablet Take 10 mg by mouth at bedtime as needed for sleep.  ? [DISCONTINUED] ALPRAZolam (XANAX) 0.5 MG tablet Take 1.5 mg by mouth 3 (three) times daily as needed for anxiety.  ? ?Facility-Administered Encounter Medications as of 06/16/2021  ?Medication  ? 0.9 %  sodium chloride infusion  ?  ? ?Past Medical History:  ?Diagnosis Date  ? Allergy   ? Anxiety   ? Asthma   ? Depression   ? GERD (gastroesophageal reflux disease)   ? Insomnia   ? Post-operative nausea and vomiting   ? Seasonal allergies   ? ? ?Past Surgical History:  ?Procedure Laterality Date  ? AUGMENTATION MAMMAPLASTY Bilateral   ? saline, retro pectoral  ? BREAST ENHANCEMENT SURGERY  2006  ? implants  ? LASIK    ? LIPOSUCTION MULTIPLE BODY PARTS  2006  ? ORIF WRIST FRACTURE  2009  ? right  ? ORIF WRIST FRACTURE Left 04/29/2014  ? Procedure: OPEN REDUCTION INTERNAL FIXATION (ORIF) WRIST FRACTURE;  Surgeon: Alta Corning, MD;  Location: [facility];  Service: Orthopedics;  Laterality: Left;  ? ? ?Family History  ?Problem Relation Age of Onset  ? Breast cancer Mother 75  ? Colon  cancer Father 29  ?     stage 3   ? Esophageal cancer Neg Hx   ? Rectal cancer Neg Hx   ? Stomach cancer Neg Hx   ? ? ?Social History  ? ?Social History Narrative  ? Not on file  ?  ? ?Review of Systems ?General: Denies fevers, chills, weight loss ?CV: Denies chest pain, shortness of breath, palpitations ? ?Physical Exam ? ?  06/16/2021  ?  2:20 PM 08/23/2016  ? 10:35 AM 08/23/2016  ? 10:25 AM  ?Vitals with BMI  ?Height '5\' 2"'$     ?Weight 201 lbs    ?BMI 36.75    ?Systolic 580 998 338  ?Diastolic 85 84 80  ?Pulse 96 69 73  ?  ?General:  No acute distress,  Alert and oriented, Non-Toxic, Normal speech and affect ?Breast: She has grade 2 ptosis.  Asymmetry with the suspected right implant deflation.  Periareolar scars  on both sides.  No obvious additional scars. ? ?Assessment/Plan ?Patient would be a good candidate for revision augmentation mastopexy.  I explained she would likely need a Wise pattern skin excision which could certainly reduce the diameter of the areola which is important to her.  I do think this could be done at the same time as a lumpectomy if that ends up being the best plan from everyone else's point of view.  We discussed removal of her saline implants and placement of new saline implants in the same subpectoral pocket.  She has no capsular contracture and would only potentially plicate the lateral aspect of the pocket if needed for symmetry.  I do think the majority the breast tissue and skin that would be removed with a mastopexy would be focused inferiorly and laterally.  We discussed risks of the procedure that include bleeding, infection, damage to surrounding structures and need for additional procedures.  All her questions were answered and we will plan to move forward. ? ?Cindra Presume ?06/16/2021, 6:07 PM  ? ? ?  ?

## 2021-06-28 ENCOUNTER — Telehealth: Payer: Self-pay | Admitting: Plastic Surgery

## 2021-06-28 NOTE — Telephone Encounter (Signed)
Returned patient's call. Advised I have not received records from Dr. Sueanne Margarita office and that she may want to follow up with their office. I also advised that I am in contact with Dr. Josetta Huddle office but I am waiting to hear from insurance company regarding what parts they will cover. Adv'd patient can call back if she still has questions/concerns.  ?

## 2021-07-08 ENCOUNTER — Telehealth: Payer: Self-pay | Admitting: Plastic Surgery

## 2021-07-08 NOTE — Telephone Encounter (Signed)
LVM to discuss scheduling surgery with Dr. Claudia Desanctis. Patient is having lumpectomy with Dr. Brantley Stage on 5/2 and we need to schedule Dr. Keane Scrape part a certain amount time out.  ?

## 2021-07-11 ENCOUNTER — Other Ambulatory Visit: Payer: Self-pay | Admitting: Surgery

## 2021-07-11 DIAGNOSIS — N6311 Unspecified lump in the right breast, upper outer quadrant: Secondary | ICD-10-CM

## 2021-07-13 ENCOUNTER — Telehealth: Payer: Self-pay | Admitting: Plastic Surgery

## 2021-07-13 NOTE — Telephone Encounter (Signed)
Called patient to find out if she wanted to proceed with the bilateral revision augmentation mastopexy and because insurance was not going to cover much, if any of the surgery patient wants to hold off on this surgery until she has the lumpectomy with Dr. Brantley Stage. She knows to call us after she heals and decides she is ready to move forward. She will need to come back in for evaluation if she chooses to have surgery at a later date.  ?

## 2021-08-02 ENCOUNTER — Other Ambulatory Visit: Payer: Self-pay

## 2021-08-02 ENCOUNTER — Encounter (HOSPITAL_BASED_OUTPATIENT_CLINIC_OR_DEPARTMENT_OTHER): Payer: Self-pay | Admitting: Surgery

## 2021-08-10 ENCOUNTER — Ambulatory Visit
Admission: RE | Admit: 2021-08-10 | Discharge: 2021-08-10 | Disposition: A | Discharge status: Home / Self Care | Payer: 59 | Source: Ambulatory Visit | Attending: Surgery | Admitting: Surgery

## 2021-08-10 DIAGNOSIS — N6311 Unspecified lump in the right breast, upper outer quadrant: Secondary | ICD-10-CM

## 2021-08-11 ENCOUNTER — Ambulatory Visit (HOSPITAL_BASED_OUTPATIENT_CLINIC_OR_DEPARTMENT_OTHER): Payer: 59 | Admitting: Anesthesiology

## 2021-08-11 ENCOUNTER — Ambulatory Visit
Admission: RE | Admit: 2021-08-11 | Discharge: 2021-08-11 | Disposition: A | Discharge status: Home / Self Care | Payer: 59 | Source: Ambulatory Visit | Attending: Surgery | Admitting: Surgery

## 2021-08-11 ENCOUNTER — Other Ambulatory Visit: Payer: Self-pay

## 2021-08-11 ENCOUNTER — Encounter (HOSPITAL_BASED_OUTPATIENT_CLINIC_OR_DEPARTMENT_OTHER): Payer: Self-pay | Admitting: Surgery

## 2021-08-11 ENCOUNTER — Encounter (HOSPITAL_BASED_OUTPATIENT_CLINIC_OR_DEPARTMENT_OTHER)
Admission: RE | Disposition: A | Discharge status: Home / Self Care | Payer: Self-pay | Source: Home / Self Care | Attending: Surgery

## 2021-08-11 ENCOUNTER — Ambulatory Visit (HOSPITAL_BASED_OUTPATIENT_CLINIC_OR_DEPARTMENT_OTHER)
Admission: RE | Admit: 2021-08-11 | Discharge: 2021-08-11 | Disposition: A | Discharge status: Home / Self Care | Payer: 59 | Attending: Surgery | Admitting: Surgery

## 2021-08-11 DIAGNOSIS — K219 Gastro-esophageal reflux disease without esophagitis: Secondary | ICD-10-CM | POA: Diagnosis not present

## 2021-08-11 DIAGNOSIS — Z6841 Body Mass Index (BMI) 40.0 and over, adult: Secondary | ICD-10-CM | POA: Insufficient documentation

## 2021-08-11 DIAGNOSIS — Z803 Family history of malignant neoplasm of breast: Secondary | ICD-10-CM | POA: Diagnosis not present

## 2021-08-11 DIAGNOSIS — J45909 Unspecified asthma, uncomplicated: Secondary | ICD-10-CM | POA: Insufficient documentation

## 2021-08-11 DIAGNOSIS — N6311 Unspecified lump in the right breast, upper outer quadrant: Secondary | ICD-10-CM | POA: Diagnosis present

## 2021-08-11 DIAGNOSIS — N6314 Unspecified lump in the right breast, lower inner quadrant: Secondary | ICD-10-CM

## 2021-08-11 DIAGNOSIS — N6489 Other specified disorders of breast: Secondary | ICD-10-CM | POA: Diagnosis not present

## 2021-08-11 HISTORY — PX: BREAST LUMPECTOMY WITH RADIOACTIVE SEED LOCALIZATION: SHX6424

## 2021-08-11 SURGERY — BREAST LUMPECTOMY WITH RADIOACTIVE SEED LOCALIZATION
Anesthesia: General | Site: Breast | Laterality: Right

## 2021-08-11 MED ORDER — OXYCODONE HCL 5 MG/5ML PO SOLN: 5.0000 mg | Freq: Once | ORAL | Status: DC | PRN

## 2021-08-11 MED ORDER — CHLORHEXIDINE GLUCONATE CLOTH 2 % EX PADS: 6.0000 | MEDICATED_PAD | Freq: Once | CUTANEOUS | Status: DC

## 2021-08-11 MED ORDER — BUPIVACAINE-EPINEPHRINE (PF) 0.25% -1:200000 IJ SOLN
INTRAMUSCULAR | Status: AC
  Filled 2021-08-11: qty 120

## 2021-08-11 MED ORDER — 0.9 % SODIUM CHLORIDE (POUR BTL) OPTIME
TOPICAL | Status: DC | PRN
  Administered 2021-08-11: 1000 mL

## 2021-08-11 MED ORDER — PROPOFOL 10 MG/ML IV BOLUS
INTRAVENOUS | Status: DC | PRN
  Administered 2021-08-11: 190 mg via INTRAVENOUS

## 2021-08-11 MED ORDER — FENTANYL CITRATE (PF) 100 MCG/2ML IJ SOLN
INTRAMUSCULAR | Status: AC
  Filled 2021-08-11: qty 2

## 2021-08-11 MED ORDER — CLINDAMYCIN PHOSPHATE 900 MG/50ML IV SOLN
INTRAVENOUS | Status: AC
  Filled 2021-08-11: qty 50

## 2021-08-11 MED ORDER — FENTANYL CITRATE (PF) 100 MCG/2ML IJ SOLN
INTRAMUSCULAR | Status: DC | PRN
  Administered 2021-08-11: 50 ug via INTRAVENOUS
  Administered 2021-08-11: 25 ug via INTRAVENOUS

## 2021-08-11 MED ORDER — OXYCODONE HCL 5 MG PO TABS: 5.0000 mg | ORAL_TABLET | Freq: Once | ORAL | Status: DC | PRN

## 2021-08-11 MED ORDER — MIDAZOLAM HCL 2 MG/2ML IJ SOLN
INTRAMUSCULAR | Status: AC
  Filled 2021-08-11: qty 2

## 2021-08-11 MED ORDER — OXYCODONE HCL 5 MG PO TABS: 5.0000 mg | ORAL_TABLET | Freq: Four times a day (QID) | ORAL | 0 refills | Status: DC | PRN

## 2021-08-11 MED ORDER — DEXAMETHASONE SODIUM PHOSPHATE 10 MG/ML IJ SOLN
INTRAMUSCULAR | Status: DC | PRN
  Administered 2021-08-11: 10 mg via INTRAVENOUS

## 2021-08-11 MED ORDER — SODIUM CHLORIDE 0.9 % IV SOLN
INTRAVENOUS | Status: AC
  Filled 2021-08-11: qty 10

## 2021-08-11 MED ORDER — BUPIVACAINE-EPINEPHRINE (PF) 0.25% -1:200000 IJ SOLN
INTRAMUSCULAR | Status: DC | PRN
  Administered 2021-08-11: 20 mL

## 2021-08-11 MED ORDER — FENTANYL CITRATE (PF) 100 MCG/2ML IJ SOLN: 25.0000 ug | INTRAMUSCULAR | Status: DC | PRN

## 2021-08-11 MED ORDER — LIDOCAINE 2% (20 MG/ML) 5 ML SYRINGE
INTRAMUSCULAR | Status: AC
  Filled 2021-08-11: qty 5

## 2021-08-11 MED ORDER — PROPOFOL 10 MG/ML IV BOLUS
INTRAVENOUS | Status: AC
  Filled 2021-08-11: qty 20

## 2021-08-11 MED ORDER — LIDOCAINE HCL (CARDIAC) PF 100 MG/5ML IV SOSY
PREFILLED_SYRINGE | INTRAVENOUS | Status: DC | PRN
  Administered 2021-08-11: 60 mg via INTRATRACHEAL

## 2021-08-11 MED ORDER — LACTATED RINGERS IV SOLN: INTRAVENOUS | Status: DC

## 2021-08-11 MED ORDER — ONDANSETRON HCL 4 MG/2ML IJ SOLN
4.0000 mg | Freq: Four times a day (QID) | INTRAMUSCULAR | Status: AC | PRN
  Administered 2021-08-11: 4 mg via INTRAVENOUS

## 2021-08-11 MED ORDER — DEXAMETHASONE SODIUM PHOSPHATE 10 MG/ML IJ SOLN
INTRAMUSCULAR | Status: AC
  Filled 2021-08-11: qty 1

## 2021-08-11 MED ORDER — PROPOFOL 500 MG/50ML IV EMUL
INTRAVENOUS | Status: DC | PRN
  Administered 2021-08-11: 25 ug/kg/min via INTRAVENOUS

## 2021-08-11 MED ORDER — ONDANSETRON HCL 4 MG/2ML IJ SOLN
INTRAMUSCULAR | Status: AC
  Filled 2021-08-11: qty 2

## 2021-08-11 MED ORDER — CLINDAMYCIN PHOSPHATE 900 MG/50ML IV SOLN
900.0000 mg | INTRAVENOUS | Status: AC
  Administered 2021-08-11: 900 mg via INTRAVENOUS

## 2021-08-11 MED ORDER — MIDAZOLAM HCL 2 MG/2ML IJ SOLN
INTRAMUSCULAR | Status: DC | PRN
  Administered 2021-08-11: 2 mg via INTRAVENOUS

## 2021-08-11 SURGICAL SUPPLY — 51 items
ADH SKN CLS APL DERMABOND .7 (GAUZE/BANDAGES/DRESSINGS) ×1
APL PRP STRL LF DISP 70% ISPRP (MISCELLANEOUS) ×1
APPLIER CLIP 9.375 MED OPEN (MISCELLANEOUS)
APR CLP MED 9.3 20 MLT OPN (MISCELLANEOUS)
BINDER BREAST LRG (GAUZE/BANDAGES/DRESSINGS) IMPLANT
BINDER BREAST MEDIUM (GAUZE/BANDAGES/DRESSINGS) IMPLANT
BINDER BREAST XLRG (GAUZE/BANDAGES/DRESSINGS) IMPLANT
BINDER BREAST XXLRG (GAUZE/BANDAGES/DRESSINGS) IMPLANT
BLADE SURG 15 STRL LF DISP TIS (BLADE) ×2 IMPLANT
BLADE SURG 15 STRL SS (BLADE) ×2
CANISTER SUC SOCK COL 7IN (MISCELLANEOUS) IMPLANT
CANISTER SUCT 1200ML W/VALVE (MISCELLANEOUS) IMPLANT
CHLORAPREP W/TINT 26 (MISCELLANEOUS) ×3 IMPLANT
CLIP APPLIE 9.375 MED OPEN (MISCELLANEOUS) IMPLANT
COVER BACK TABLE 60X90IN (DRAPES) ×3 IMPLANT
COVER MAYO STAND STRL (DRAPES) ×3 IMPLANT
COVER PROBE W GEL 5X96 (DRAPES) ×3 IMPLANT
DERMABOND ADVANCED (GAUZE/BANDAGES/DRESSINGS) ×1
DERMABOND ADVANCED .7 DNX12 (GAUZE/BANDAGES/DRESSINGS) ×2 IMPLANT
DRAPE LAPAROSCOPIC ABDOMINAL (DRAPES) IMPLANT
DRAPE LAPAROTOMY 100X72 PEDS (DRAPES) ×3 IMPLANT
DRAPE UTILITY XL STRL (DRAPES) ×3 IMPLANT
ELECT COATED BLADE 2.86 ST (ELECTRODE) ×3 IMPLANT
ELECT REM PT RETURN 9FT ADLT (ELECTROSURGICAL) ×2
ELECTRODE REM PT RTRN 9FT ADLT (ELECTROSURGICAL) ×2 IMPLANT
GLOVE BIOGEL PI IND STRL 8 (GLOVE) ×2 IMPLANT
GLOVE BIOGEL PI INDICATOR 8 (GLOVE) ×1
GLOVE ECLIPSE 8.0 STRL XLNG CF (GLOVE) ×3 IMPLANT
GOWN STRL REUS W/ TWL LRG LVL3 (GOWN DISPOSABLE) ×4 IMPLANT
GOWN STRL REUS W/ TWL XL LVL3 (GOWN DISPOSABLE) ×2 IMPLANT
GOWN STRL REUS W/TWL LRG LVL3 (GOWN DISPOSABLE) ×4
GOWN STRL REUS W/TWL XL LVL3 (GOWN DISPOSABLE) ×2
HEMOSTAT ARISTA ABSORB 3G PWDR (HEMOSTASIS) IMPLANT
HEMOSTAT SNOW SURGICEL 2X4 (HEMOSTASIS) IMPLANT
KIT MARKER MARGIN INK (KITS) ×3 IMPLANT
NDL HYPO 25X1 1.5 SAFETY (NEEDLE) ×2 IMPLANT
NEEDLE HYPO 25X1 1.5 SAFETY (NEEDLE) ×2 IMPLANT
NS IRRIG 1000ML POUR BTL (IV SOLUTION) ×3 IMPLANT
PACK BASIN DAY SURGERY FS (CUSTOM PROCEDURE TRAY) ×3 IMPLANT
PENCIL SMOKE EVACUATOR (MISCELLANEOUS) ×3 IMPLANT
SLEEVE SCD COMPRESS KNEE MED (STOCKING) ×3 IMPLANT
SPIKE FLUID TRANSFER (MISCELLANEOUS) IMPLANT
SPONGE T-LAP 4X18 ~~LOC~~+RFID (SPONGE) ×3 IMPLANT
SUT MNCRL AB 4-0 PS2 18 (SUTURE) ×3 IMPLANT
SUT SILK 2 0 SH (SUTURE) IMPLANT
SUT VICRYL 3-0 CR8 SH (SUTURE) ×3 IMPLANT
SYR CONTROL 10ML LL (SYRINGE) ×3 IMPLANT
TOWEL GREEN STERILE FF (TOWEL DISPOSABLE) ×3 IMPLANT
TRAY FAXITRON CT DISP (TRAY / TRAY PROCEDURE) ×3 IMPLANT
TUBE CONNECTING 20X1/4 (TUBING) IMPLANT
YANKAUER SUCT BULB TIP NO VENT (SUCTIONS) IMPLANT

## 2021-08-11 NOTE — Transfer of Care (Signed)
Immediate Anesthesia Transfer of Care Note  Patient: [name]  Procedure(s) Performed: RADIOACTIVE SEED GUIDED RIGHT BREAST LUMPECTOMY (Right: Breast)  Patient Location: PACU  Anesthesia Type:General  Level of Consciousness: drowsy, patient cooperative and responds to stimulation  Airway & Oxygen Therapy: Patient Spontanous Breathing and Patient connected to face mask oxygen  Post-op Assessment: Report given to RN and Post -op Vital signs reviewed and stable  Post vital signs: Reviewed and stable  Last Vitals:  Vitals Value Taken Time  BP 121/66 08/11/21 0830  Temp    Pulse 90 08/11/21 0832  Resp 17 08/11/21 0832  SpO2 100 % 08/11/21 0832  Vitals shown include unvalidated device data.  Last Pain:  Vitals:   08/11/21 0636  TempSrc: Oral  PainSc: 0-No pain      Patients Stated Pain Goal: 2 (16/83/72 9021)  Complications: No notable events documented.

## 2021-08-11 NOTE — Anesthesia Procedure Notes (Signed)
Procedure Name: LMA Insertion Date/Time: 08/11/2021 7:47 AM Performed by: Glory Buff, CRNA Pre-anesthesia Checklist: Patient identified, Emergency Drugs available, Suction available and Patient being monitored Patient Re-evaluated:Patient Re-evaluated prior to induction Oxygen Delivery Method: Circle system utilized Preoxygenation: Pre-oxygenation with 100% oxygen Induction Type: IV induction LMA: LMA inserted LMA Size: 4.0 Number of attempts: 1 Placement Confirmation: positive ETCO2 Tube secured with: Tape Dental Injury: Teeth and Oropharynx as per pre-operative assessment

## 2021-08-11 NOTE — Anesthesia Postprocedure Evaluation (Signed)
Anesthesia Post Note  Patient: Ebony Herman  Procedure(s) Performed: RADIOACTIVE SEED GUIDED RIGHT BREAST LUMPECTOMY (Right: Breast)     Patient location during evaluation: PACU Anesthesia Type: General Level of consciousness: awake and alert Pain management: pain level controlled Vital Signs Assessment: post-procedure vital signs reviewed and stable Respiratory status: spontaneous breathing, nonlabored ventilation, respiratory function stable and patient connected to nasal cannula oxygen Cardiovascular status: blood pressure returned to baseline and stable Postop Assessment: no apparent nausea or vomiting Anesthetic complications: no   No notable events documented.  Last Vitals:  Vitals:   08/11/21 0845 08/11/21 0916  BP: 125/69 (!) 133/53  Pulse: 86 86  Resp: 14 16  Temp:  36.6 C  SpO2: 98% 96%    Last Pain:  Vitals:   08/11/21 0916  TempSrc:   PainSc: 0-No pain                 Eleshia Wooley S

## 2021-08-11 NOTE — H&P (Signed)
History of Present Illness: Ebony Herman is a 61 y.o. female who is seen today as an office consultation for evaluation of New Consultation .   Patient presents from the breast center in [place] due to a new right breast mass. She underwent core biopsy which showed a sclerotic lesion. The pathology report was unable to differentiate this from scar versus radial scar. She has a history of bilateral breast augmentation with implants dating back 20 years which are saline-based. The right implant is deflated and she is seeking reconstructive options for revision of her implants, breast lift and/or nipple reduction. The mass was noted right breast upper outer quadrant and concern was for this to be a radial scar. Family history of breast cancer with her mother. Patient denies any symptoms of mass lesion but is unhappy with her appearance.   Review of Systems: A complete review of systems was obtained from the patient. I have reviewed this information and discussed as appropriate with the patient. See HPI as well for other ROS.  Review of Systems  Constitutional: Negative.  HENT: Negative.  Eyes: Negative.  Respiratory: Negative.  Cardiovascular: Negative.  Gastrointestinal: Negative.  Genitourinary: Negative.  Musculoskeletal: Negative.  Skin: Negative.    Medical History: History reviewed. No pertinent past medical history.  There is no problem list on file for this patient.  Past Surgical History:  Procedure Laterality Date   breast augmentation and lift    Allergies  Allergen Reactions   Fluticasone Propionate Other (See Comments)  bleeding   Penicillins Hives  itch   Current Outpatient Medications on File Prior to Visit  Medication Sig Dispense Refill   ALPRAZolam (XANAX) 0.5 MG tablet Take by mouth   APLENZIN 348 mg Tb24   doxepin (SINEQUAN) 50 MG capsule Take 50 mg by mouth at bedtime   lamoTRIgine (LAMICTAL) 100 MG tablet Take 100 mg by mouth 2 (two) times daily    VRAYLAR 3 mg capsule   VYVANSE 50 mg capsule Take 50 mg by mouth every morning   zolpidem (AMBIEN) 10 mg tablet   No current facility-administered medications on file prior to visit.   Family History  Problem Relation Age of Onset   Obesity Mother   High blood pressure (Hypertension) Mother   Hyperlipidemia (Elevated cholesterol) Mother   Breast cancer Mother   Colon cancer Father   Myocardial Infarction (Heart attack) Father    Social History   Tobacco Use  Smoking Status Never  Smokeless Tobacco Never    Social History   Socioeconomic History   Marital status: Divorced  Tobacco Use   Smoking status: Never   Smokeless tobacco: Never   Objective:   Vitals:  06/06/21 1048  BP: (!) 168/84  Pulse: (!) 114  Weight: (!) 105.6 kg (232 lb 12.8 oz)  Height: 157.5 cm ('5\' 2"'$ )   Body mass index is 42.58 kg/m.  Physical Exam Constitutional:  Appearance: Normal appearance.  HENT:  Head: Normocephalic.  Nose: Nose normal.  Eyes:  General: No scleral icterus. Cardiovascular:  Rate and Rhythm: Normal rate.  Pulmonary:  Effort: Pulmonary effort is normal.  Breath sounds: No stridor.  Chest:  Comments: Right breast shows augmentation that is significantly smaller than the left side. Implant is not palpable easily. No masses on the right side. Scars noted. Left side shows an intact implant. This is migrated laterally. Scar noted around nipple. No masses in the left breast. Musculoskeletal:  General: Normal range of motion.  Cervical back: Normal range of  motion.  Skin: General: Skin is warm and dry.  Neurological:  General: No focal deficit present.  Mental Status: She is alert.  Psychiatric:  Mood and Affect: Mood normal.  Behavior: Behavior normal.     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 There is concern this may be a radial scar. We discussed the potential risk of atypia or malignancy being around 10% if that. Excision is recommended for these lesions to exclude that. She desires revisional plastic surgery and request referral to a plastic surgeon to discuss these options. She like to have her implants revised, breast lift the potential nipple reduction. Her right implant is obviously ruptured at this point and needs to be removed more than likely. When she is seen by plastic surgery we can coordinate care but I recommend right breast seed localized lumpectomy for radial scar.The procedure has been discussed with the patient. Alternatives to surgery have been discussed with the patient. Risks of surgery include bleeding, Infection, Seroma formation, death, and the need for further surgery. The patient understands and wishes to proceed.  No follow-ups on file.  Kennieth Francois, MD

## 2021-08-11 NOTE — Discharge Instructions (Addendum)
[facility] Office Phone Number [phone]  BREAST BIOPSY/ PARTIAL MASTECTOMY: POST OP INSTRUCTIONS  Always review your discharge instruction sheet given to you by the facility where your surgery was performed.  IF YOU HAVE DISABILITY OR FAMILY LEAVE FORMS, YOU MUST BRING THEM TO THE OFFICE FOR PROCESSING.  DO NOT GIVE THEM TO YOUR DOCTOR.  A prescription for pain medication may be given to you upon discharge.  Take your pain medication as prescribed, if needed.  If narcotic pain medicine is not needed, then you may take acetaminophen (Tylenol) or ibuprofen (Advil) as needed. Take your usually prescribed medications unless otherwise directed If you need a refill on your pain medication, please contact your pharmacy.  They will contact our office to request authorization.  Prescriptions will not be filled after 5pm or on week-ends. You should eat very light the first 24 hours after surgery, such as soup, crackers, pudding, etc.  Resume your normal diet the day after surgery. Most patients will experience some swelling and bruising in the breast.  Ice packs and a good support bra will help.  Swelling and bruising can take several days to resolve.  It is common to experience some constipation if taking pain medication after surgery.  Increasing fluid intake and taking a stool softener will usually help or prevent this problem from occurring.  A mild laxative (Milk of Magnesia or Miralax) should be taken according to package directions if there are no bowel movements after 48 hours. Unless discharge instructions indicate otherwise, you may remove your bandages 24-48 hours after surgery, and you may shower at that time.  You may have steri-strips (small skin tapes) in place directly over the incision.  These strips should be left on the skin for 7-10 days.  If your surgeon used skin glue on the incision, you may shower in 24 hours.  The glue will flake off over the next 2-3 weeks.  Any  sutures or staples will be removed at the office during your follow-up visit. ACTIVITIES:  You may resume regular daily activities (gradually increasing) beginning the next day.  Wearing a good support bra or sports bra minimizes pain and swelling.  You may have sexual intercourse when it is comfortable. You may drive when you no longer are taking prescription pain medication, you can comfortably wear a seatbelt, and you can safely maneuver your car and apply brakes. RETURN TO WORK:  ______________________________________________________________________________________ You should see your doctor in the office for a follow-up appointment approximately two weeks after your surgery.  Your doctor's nurse will typically make your follow-up appointment when she calls you with your pathology report.  Expect your pathology report 2-3 business days after your surgery.  You may call to check if you do not hear from us after three days. OTHER INSTRUCTIONS: _______________________________________________________________________________________________ _____________________________________________________________________________________________________________________________________ _____________________________________________________________________________________________________________________________________ _____________________________________________________________________________________________________________________________________  WHEN TO CALL YOUR DOCTOR: Fever over 101.0 Nausea and/or vomiting. Extreme swelling or bruising. Continued bleeding from incision. Increased pain, redness, or drainage from the incision.  The clinic staff is available to answer your questions during regular business hours.  Please don't hesitate to call and ask to speak to one of the nurses for clinical concerns.  If you have a medical emergency, go to the nearest emergency room or call 911.  A surgeon from [facility] is always on call at the hospital.  For further questions, please visit [email or web address]    Post Anesthesia Home Care Instructions  Activity: Get plenty of rest for the remainder of   the day. A responsible individual must stay with you for 24 hours following the procedure.  For the next 24 hours, DO NOT: -Drive a car -Operate machinery -Drink alcoholic beverages -Take any medication unless instructed by your physician -Make any legal decisions or sign important papers.  Meals: Start with liquid foods such as gelatin or soup. Progress to regular foods as tolerated. Avoid greasy, spicy, heavy foods. If nausea and/or vomiting occur, drink only clear liquids until the nausea and/or vomiting subsides. Call your physician if vomiting continues.  Special Instructions/Symptoms: Your throat may feel dry or sore from the anesthesia or the breathing tube placed in your throat during surgery. If this causes discomfort, gargle with warm salt water. The discomfort should disappear within 24 hours.  If you had a scopolamine patch placed behind your ear for the management of post- operative nausea and/or vomiting:  1. The medication in the patch is effective for 72 hours, after which it should be removed.  Wrap patch in a tissue and discard in the trash. Wash hands thoroughly with soap and water. 2. You may remove the patch earlier than 72 hours if you experience unpleasant side effects which may include dry mouth, dizziness or visual disturbances. 3. Avoid touching the patch. Wash your hands with soap and water after contact with the patch.      

## 2021-08-11 NOTE — Anesthesia Preprocedure Evaluation (Addendum)
Anesthesia Evaluation  Patient identified by MRN, date of birth, ID band Patient awake    Reviewed: Allergy & Precautions, H&P , NPO status , Patient's Chart, lab work & pertinent test results  History of Anesthesia Complications (+) PONV and history of anesthetic complications  Airway Mallampati: II   Neck ROM: full    Dental   Pulmonary asthma ,    breath sounds clear to auscultation       Cardiovascular negative cardio ROS   Rhythm:regular Rate:Normal     Neuro/Psych PSYCHIATRIC DISORDERS Anxiety Depression    GI/Hepatic GERD  ,  Endo/Other  Morbid obesity  Renal/GU      Musculoskeletal   Abdominal   Peds  Hematology   Anesthesia Other Findings   Reproductive/Obstetrics Right breast mass                             Anesthesia Physical Anesthesia Plan  ASA: 2  Anesthesia Plan: General   Post-op Pain Management:    Induction: Intravenous  PONV Risk Score and Plan: 4 or greater and Ondansetron, Dexamethasone, Midazolam and Treatment may vary due to age or medical condition  Airway Management Planned: LMA  Additional Equipment:   Intra-op Plan:   Post-operative Plan: Extubation in OR  Informed Consent: I have reviewed the patients History and Physical, chart, labs and discussed the procedure including the risks, benefits and alternatives for the proposed anesthesia with the patient or authorized representative who has indicated his/her understanding and acceptance.     Dental advisory given  Plan Discussed with: CRNA, Anesthesiologist and Surgeon  Anesthesia Plan Comments:         Anesthesia Quick Evaluation

## 2021-08-11 NOTE — Op Note (Signed)
Preoperative diagnosis: Right breast mass upper outer quadrant  Postoperative diagnosis: Right breast mass lower inner quadrant  Procedure: Right breast seed localized lumpectomy  Surgeon: Thomas Cornett, MD  Anesthesia: LMA with 0.25% Marcaine  EBL: Minimal  Specimen: Right breast tissue with seed and clip verified by the Faxitron  Drains: None  IV fluids: Per anesthesia record  Indications for procedure: The patient is a 60-year-old female with a right breast mass.  This felt to be sclerotic in nature and there was concern for a possible radial scar.  We discussed the rationale for removal.  We discussed potential upgrade risk to malignancy in the circumstance.  We discussed potential observation.  After discussion of all the above she opted to have a right breast lumpectomy.The procedure has been discussed with the patient. Alternatives to surgery have been discussed with the patient.  Risks of surgery include bleeding,  Infection,  Seroma formation, death,  and the need for further surgery.   The patient understands and wishes to proceed.      Description of procedure: The patient was met in the holding area and questions were answered.  Of note she had a seed placed as an outpatient.  Films were available for review and the right side was marked as the correct site verifying a single seed.  She was then taken back to the operating.  She is placed supine upon the OR table.  After induction of general anesthesia, right breast was prepped and draped in a sterile fashion and a timeout was performed.  Films were available for review.  Neoprobe was used to identify the seed which was located below the nipple centrally located but more toward the right lower inner quadrant.  A curvilinear incision was made along the medial border of the nipple areolar complex through a previous scar.  Dissection was carried down and all tissue around the seed and clip were excised with a grossly negative margin.   The Faxitron image revealed the seed and clip to be in the specimen with a grossly negative margin.  The wound was found to be hemostatic.  Local anesthetic was infiltrated.  We then closed the wound with 3-0 Vicryl for Monocryl.  Dermabond was applied.  All counts found to be correct.  The patient was then awoke extubated taken to recovery in satisfactory condition. 

## 2021-08-11 NOTE — Interval H&P Note (Signed)
History and Physical Interval Note:  08/11/2021 7:22 AM  Ebony Herman  has presented today for surgery, with the diagnosis of RIGHT BREAST MASS.  The various methods of treatment have been discussed with the patient and family. After consideration of risks, benefits and other options for treatment, the patient has consented to  Procedure(s): RIGHT BREAST LUMPECTOMY WITH RADIOACTIVE SEED LOCALIZATION (Right) as a surgical intervention.  The patient's history has been reviewed, patient examined, no change in status, stable for surgery.  I have reviewed the patient's chart and labs.  Questions were answered to the patient's satisfaction.     [place]

## 2021-08-12 ENCOUNTER — Encounter (HOSPITAL_BASED_OUTPATIENT_CLINIC_OR_DEPARTMENT_OTHER): Payer: Self-pay | Admitting: Surgery

## 2021-08-12 LAB — SURGICAL PATHOLOGY

## 2021-08-16 ENCOUNTER — Encounter: Payer: Self-pay | Admitting: Surgery

## 2021-09-01 ENCOUNTER — Encounter (HOSPITAL_COMMUNITY): Payer: Self-pay

## 2021-09-19 ENCOUNTER — Encounter: Payer: Self-pay | Admitting: Internal Medicine

## 2021-12-28 ENCOUNTER — Other Ambulatory Visit: Payer: Self-pay | Admitting: Family Medicine

## 2021-12-28 DIAGNOSIS — Z1231 Encounter for screening mammogram for malignant neoplasm of breast: Secondary | ICD-10-CM

## 2022-04-27 ENCOUNTER — Ambulatory Visit
Admission: RE | Admit: 2022-04-27 | Discharge: 2022-04-27 | Disposition: A | Discharge status: Home / Self Care | Payer: 59 | Source: Ambulatory Visit | Attending: Family Medicine | Admitting: Family Medicine

## 2022-04-27 DIAGNOSIS — Z1231 Encounter for screening mammogram for malignant neoplasm of breast: Secondary | ICD-10-CM

## 2022-05-01 ENCOUNTER — Other Ambulatory Visit: Payer: Self-pay | Admitting: Family Medicine

## 2022-05-01 DIAGNOSIS — R928 Other abnormal and inconclusive findings on diagnostic imaging of breast: Secondary | ICD-10-CM

## 2022-05-05 ENCOUNTER — Ambulatory Visit: Payer: 59

## 2022-05-05 ENCOUNTER — Ambulatory Visit
Admission: RE | Admit: 2022-05-05 | Discharge: 2022-05-05 | Disposition: A | Discharge status: Home / Self Care | Payer: 59 | Source: Ambulatory Visit | Attending: Family Medicine | Admitting: Family Medicine

## 2022-05-05 DIAGNOSIS — R928 Other abnormal and inconclusive findings on diagnostic imaging of breast: Secondary | ICD-10-CM

## 2022-05-08 ENCOUNTER — Other Ambulatory Visit: Payer: Self-pay | Admitting: Family Medicine

## 2022-05-08 DIAGNOSIS — N6489 Other specified disorders of breast: Secondary | ICD-10-CM

## 2022-05-11 ENCOUNTER — Ambulatory Visit
Admission: RE | Admit: 2022-05-11 | Discharge: 2022-05-11 | Disposition: A | Discharge status: Home / Self Care | Payer: 59 | Source: Ambulatory Visit | Attending: Family Medicine | Admitting: Family Medicine

## 2022-05-11 DIAGNOSIS — N6489 Other specified disorders of breast: Secondary | ICD-10-CM

## 2022-05-11 HISTORY — PX: BREAST BIOPSY: SHX20

## 2022-06-05 ENCOUNTER — Ambulatory Visit: Payer: Self-pay | Admitting: Surgery

## 2022-06-05 DIAGNOSIS — N6489 Other specified disorders of breast: Secondary | ICD-10-CM

## 2022-06-07 ENCOUNTER — Other Ambulatory Visit: Payer: Self-pay | Admitting: Surgery

## 2022-06-07 DIAGNOSIS — N6489 Other specified disorders of breast: Secondary | ICD-10-CM

## 2022-07-13 ENCOUNTER — Encounter (HOSPITAL_BASED_OUTPATIENT_CLINIC_OR_DEPARTMENT_OTHER): Payer: Self-pay | Admitting: Surgery

## 2022-07-19 ENCOUNTER — Ambulatory Visit
Admission: RE | Admit: 2022-07-19 | Discharge: 2022-07-19 | Disposition: A | Discharge status: Home / Self Care | Payer: 59 | Source: Ambulatory Visit | Attending: Surgery | Admitting: Surgery

## 2022-07-19 DIAGNOSIS — N6489 Other specified disorders of breast: Secondary | ICD-10-CM

## 2022-07-19 HISTORY — PX: BREAST BIOPSY: SHX20

## 2022-07-19 NOTE — Progress Notes (Signed)
Gave patient CHG soap with instructions, patient verbalized understanding.       Patient Instructions  The night before surgery:  No food after midnight. ONLY clear liquids after midnight  The day of surgery (if you do NOT have diabetes):  Drink ONE (1) Pre-Surgery Clear Ensure as directed.   This drink was given to you during your hospital  pre-op appointment visit. The pre-op nurse will instruct you on the time to drink the  Pre-Surgery Ensure depending on your surgery time. Finish the drink at the designated time by the pre-op nurse.  Nothing else to drink after completing the  Pre-Surgery Clear Ensure.          If you have questions, please contact your surgeon's office. 

## 2022-07-20 ENCOUNTER — Other Ambulatory Visit: Payer: Self-pay

## 2022-07-20 ENCOUNTER — Ambulatory Visit (HOSPITAL_BASED_OUTPATIENT_CLINIC_OR_DEPARTMENT_OTHER): Payer: 59 | Admitting: Anesthesiology

## 2022-07-20 ENCOUNTER — Ambulatory Visit (HOSPITAL_BASED_OUTPATIENT_CLINIC_OR_DEPARTMENT_OTHER)
Admission: RE | Admit: 2022-07-20 | Discharge: 2022-07-20 | Disposition: A | Discharge status: Home / Self Care | Payer: 59 | Attending: Surgery | Admitting: Surgery

## 2022-07-20 ENCOUNTER — Encounter (HOSPITAL_BASED_OUTPATIENT_CLINIC_OR_DEPARTMENT_OTHER): Payer: Self-pay | Admitting: Surgery

## 2022-07-20 ENCOUNTER — Encounter (HOSPITAL_BASED_OUTPATIENT_CLINIC_OR_DEPARTMENT_OTHER)
Admission: RE | Disposition: A | Discharge status: Home / Self Care | Payer: Self-pay | Source: Home / Self Care | Attending: Surgery

## 2022-07-20 ENCOUNTER — Ambulatory Visit
Admission: RE | Admit: 2022-07-20 | Discharge: 2022-07-20 | Disposition: A | Discharge status: Home / Self Care | Payer: 59 | Source: Ambulatory Visit | Attending: Surgery | Admitting: Surgery

## 2022-07-20 DIAGNOSIS — F418 Other specified anxiety disorders: Secondary | ICD-10-CM

## 2022-07-20 DIAGNOSIS — J45909 Unspecified asthma, uncomplicated: Secondary | ICD-10-CM | POA: Diagnosis not present

## 2022-07-20 DIAGNOSIS — N6032 Fibrosclerosis of left breast: Secondary | ICD-10-CM | POA: Insufficient documentation

## 2022-07-20 DIAGNOSIS — F32A Depression, unspecified: Secondary | ICD-10-CM | POA: Insufficient documentation

## 2022-07-20 DIAGNOSIS — L905 Scar conditions and fibrosis of skin: Secondary | ICD-10-CM | POA: Diagnosis not present

## 2022-07-20 DIAGNOSIS — E669 Obesity, unspecified: Secondary | ICD-10-CM | POA: Diagnosis not present

## 2022-07-20 DIAGNOSIS — N6022 Fibroadenosis of left breast: Secondary | ICD-10-CM | POA: Diagnosis not present

## 2022-07-20 DIAGNOSIS — N6489 Other specified disorders of breast: Secondary | ICD-10-CM

## 2022-07-20 DIAGNOSIS — F419 Anxiety disorder, unspecified: Secondary | ICD-10-CM | POA: Insufficient documentation

## 2022-07-20 DIAGNOSIS — Z6838 Body mass index (BMI) 38.0-38.9, adult: Secondary | ICD-10-CM

## 2022-07-20 HISTORY — PX: BREAST LUMPECTOMY WITH RADIOACTIVE SEED LOCALIZATION: SHX6424

## 2022-07-20 IMAGING — MG MM PLC BREAST LOC DEV 1ST LESION INC*R*
6 series · 6 of 6 positions shown · non-contrast
Comparison: Previous exams.

CLINICAL DATA: 60-year-old with a biopsy-proven possible sclerosing
lesion involving the LOWER INNER QUADRANT of the RIGHT breast at
middle depth, associated with a coil shaped tissue marking clip.
Radioactive seed localization is performed in anticipation of
excisional biopsy.

The patient has indwelling retropectoral saline implants, and the
RIGHT implant was shown to be deflated on the screening mammogram
04/26/2021.
EXAM:
MAMMOGRAPHIC GUIDED RADIOACTIVE SEED LOCALIZATION OF THE RIGHT
BREAST

[R CC (1 of 4)]
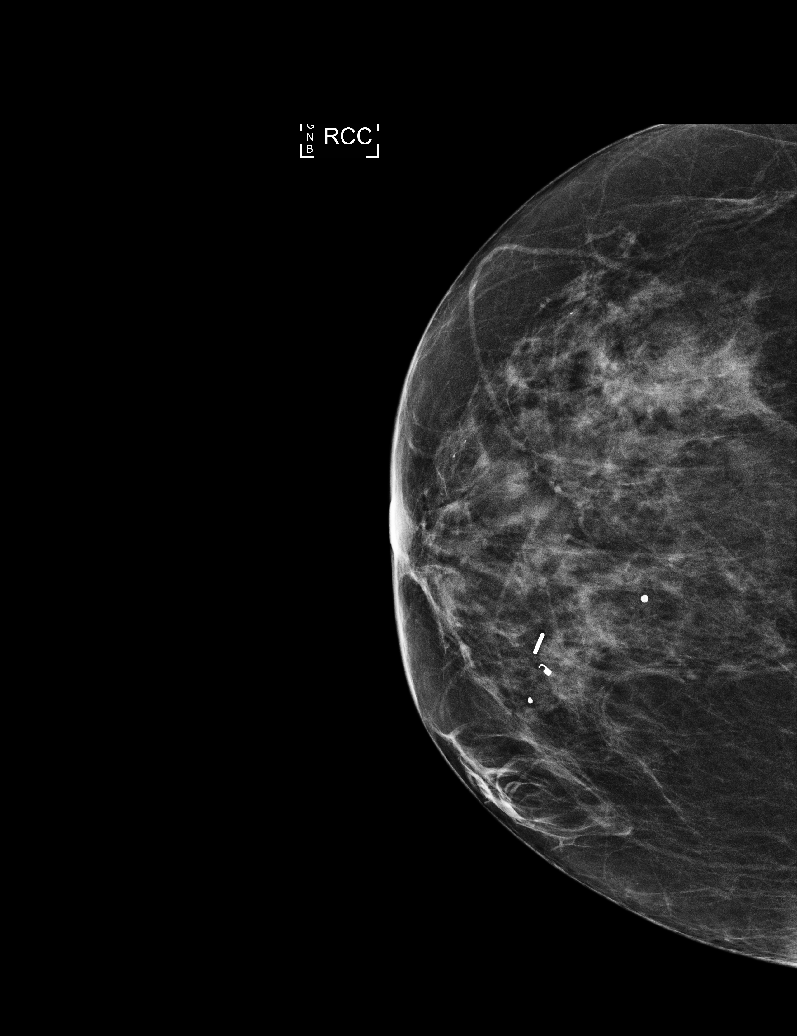

[R CC (2 of 4)]
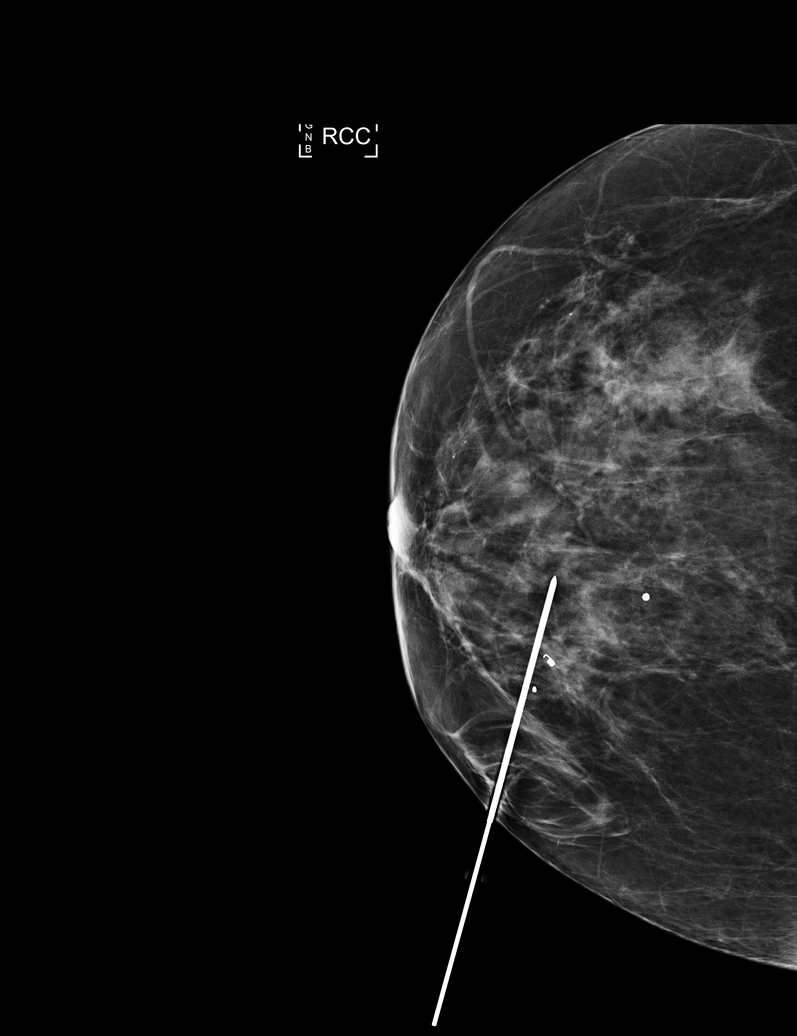

[R CC (3 of 4)]
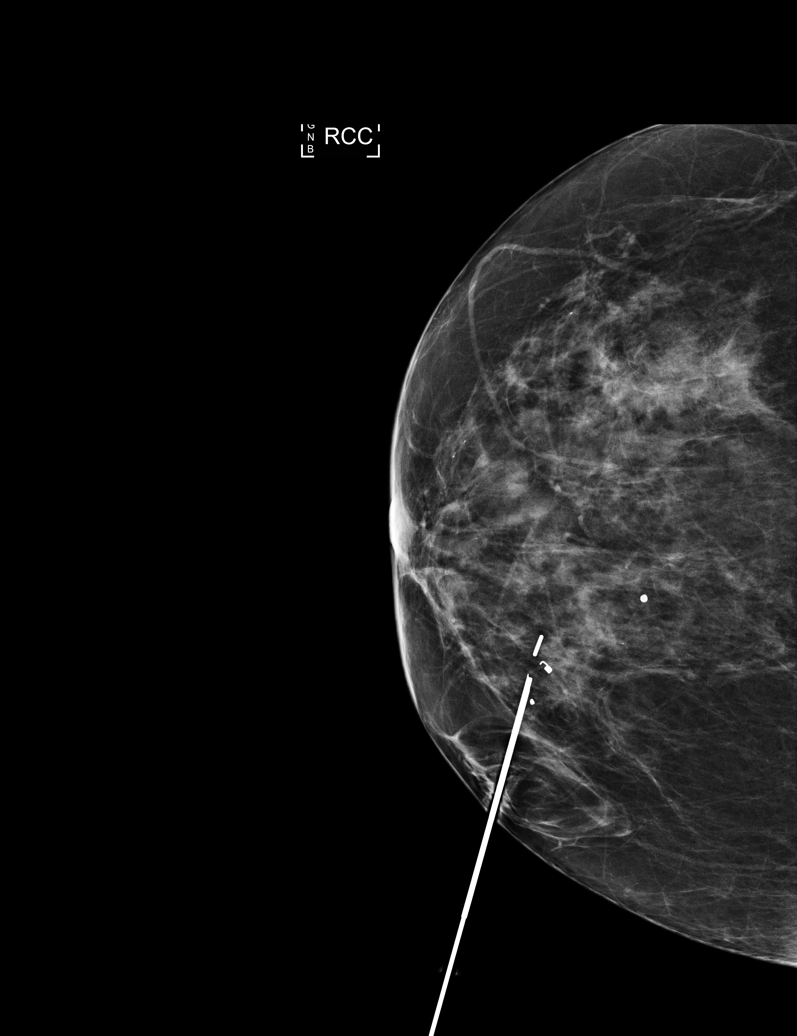

[R CC (4 of 4)]
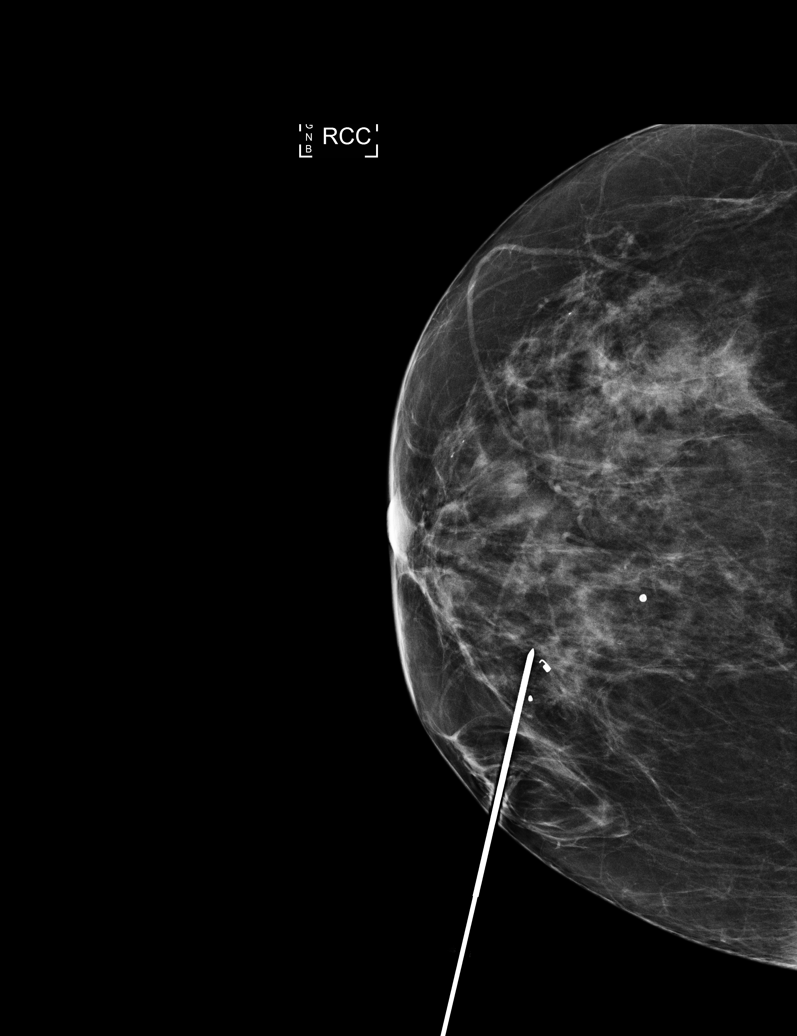

[R ML (1 of 2)]
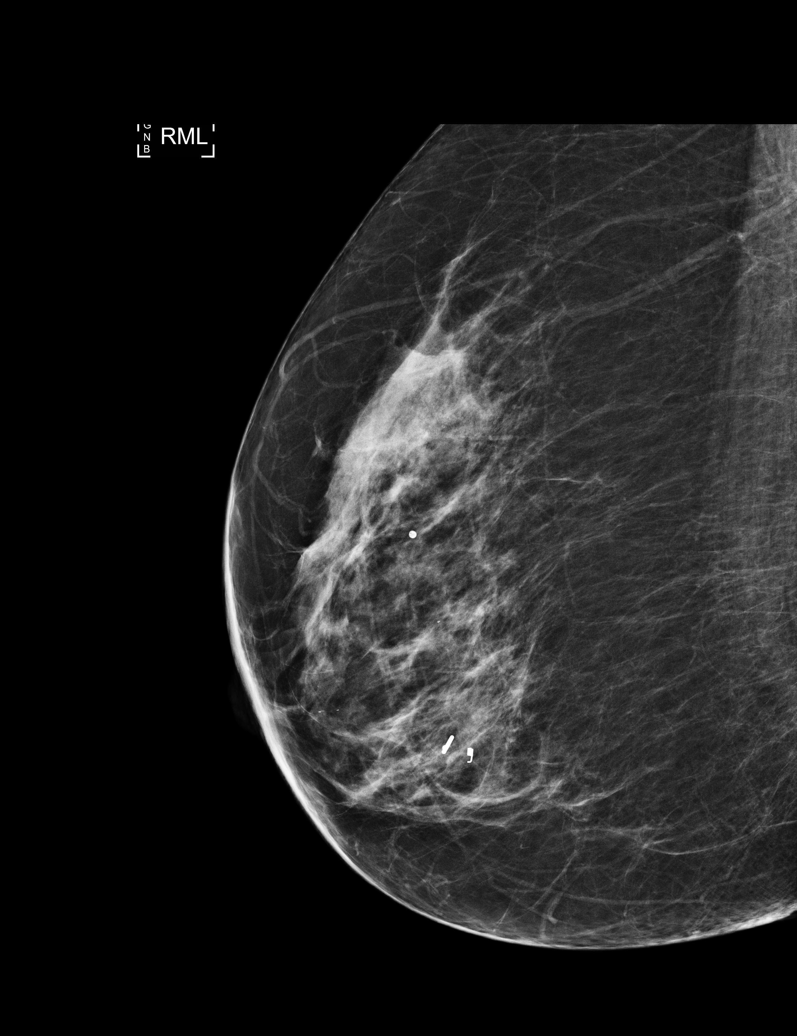

[R ML (2 of 2)]
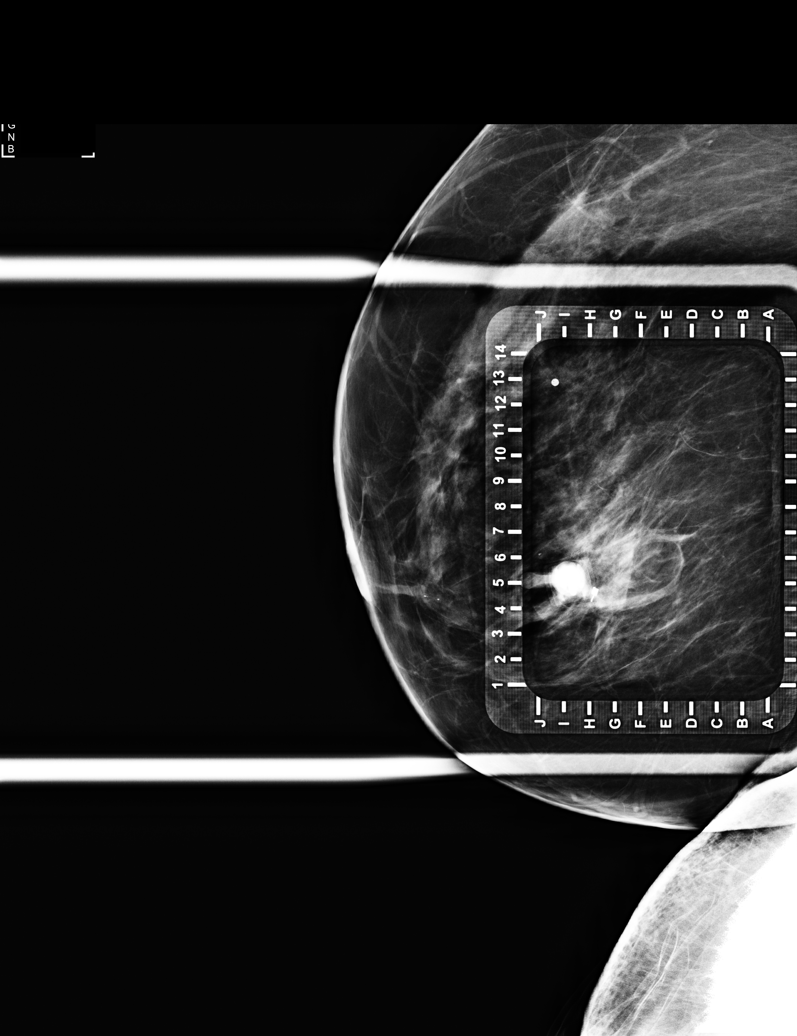

[6 of 6 positions shown; findings below may reference images not displayed]

FINDINGS: Patient presents for radioactive seed localization prior to RIGHT
breast excisional biopsy. I met with the patient and we discussed
the procedure of seed localization including benefits and
alternatives. We discussed the high likelihood of a successful
procedure. We discussed the risks of the procedure including
infection, bleeding, tissue injury and further surgery. We discussed
the low dose of radioactivity involved in the procedure. Informed,
written consent was given.

The usual time-out protocol was performed immediately prior to the
procedure.

Using mammographic guidance, sterile technique with chlorhexidine as
skin antisepsis, 1% lidocaine as local anesthetic, an 2-BDD
radioactive seed was used to localize the coil shaped tissue marking
clip associated with the biopsy-proven possible sclerosing lesion
using a medial approach. The follow-up mammogram images confirm that
the seed is in the appropriate position immediately adjacent to the
clip. The images are marked for Dr. Ashfaan.

Follow-up survey of the patient confirms the presence of the
radioactive seed.

Order number of 2-BDD seed: [id number]

Total activity: 0.260 mCi

Reference Date: 07/04/2021

The patient tolerated the procedure well and was released from the
[REDACTED]. She was given instructions regarding seed removal.
IMPRESSION: Radioactive seed localization of a biopsy-proven sclerosing lesion
involving the LOWER INNER QUADRANT of the RIGHT breast. No apparent
complications.

## 2022-07-20 SURGERY — BREAST LUMPECTOMY WITH RADIOACTIVE SEED LOCALIZATION
Anesthesia: General | Site: Breast | Laterality: Left

## 2022-07-20 MED ORDER — DEXAMETHASONE SODIUM PHOSPHATE 10 MG/ML IJ SOLN
INTRAMUSCULAR | Status: AC
  Filled 2022-07-20: qty 1

## 2022-07-20 MED ORDER — OXYCODONE HCL 5 MG/5ML PO SOLN: 5.0000 mg | Freq: Once | ORAL | Status: DC | PRN

## 2022-07-20 MED ORDER — OXYCODONE HCL 5 MG PO TABS: 5.0000 mg | ORAL_TABLET | Freq: Once | ORAL | Status: DC | PRN

## 2022-07-20 MED ORDER — PROMETHAZINE HCL 25 MG/ML IJ SOLN: 6.2500 mg | INTRAMUSCULAR | Status: DC | PRN

## 2022-07-20 MED ORDER — CHLORHEXIDINE GLUCONATE CLOTH 2 % EX PADS: 6.0000 | MEDICATED_PAD | Freq: Once | CUTANEOUS | Status: DC

## 2022-07-20 MED ORDER — CLINDAMYCIN PHOSPHATE 900 MG/50ML IV SOLN
900.0000 mg | INTRAVENOUS | Status: AC
  Administered 2022-07-20: 900 mg via INTRAVENOUS

## 2022-07-20 MED ORDER — DROPERIDOL 2.5 MG/ML IJ SOLN
INTRAMUSCULAR | Status: AC
  Filled 2022-07-20: qty 2

## 2022-07-20 MED ORDER — PROPOFOL 10 MG/ML IV BOLUS
INTRAVENOUS | Status: DC | PRN
  Administered 2022-07-20: 200 mg via INTRAVENOUS

## 2022-07-20 MED ORDER — BUPIVACAINE HCL (PF) 0.25 % IJ SOLN
INTRAMUSCULAR | Status: AC
  Filled 2022-07-20: qty 30

## 2022-07-20 MED ORDER — ONDANSETRON HCL 4 MG/2ML IJ SOLN
INTRAMUSCULAR | Status: DC | PRN
  Administered 2022-07-20: 4 mg via INTRAVENOUS

## 2022-07-20 MED ORDER — PHENYLEPHRINE 80 MCG/ML (10ML) SYRINGE FOR IV PUSH (FOR BLOOD PRESSURE SUPPORT)
PREFILLED_SYRINGE | INTRAVENOUS | Status: AC
  Filled 2022-07-20: qty 10

## 2022-07-20 MED ORDER — OXYCODONE HCL 5 MG PO TABS
5.0000 mg | ORAL_TABLET | Freq: Four times a day (QID) | ORAL | 0 refills | Status: DC | PRN
Start: 2022-07-20 — End: 2023-10-01

## 2022-07-20 MED ORDER — LIDOCAINE HCL (CARDIAC) PF 100 MG/5ML IV SOSY
PREFILLED_SYRINGE | INTRAVENOUS | Status: DC | PRN
  Administered 2022-07-20: 60 mg via INTRATRACHEAL

## 2022-07-20 MED ORDER — ONDANSETRON HCL 4 MG/2ML IJ SOLN
INTRAMUSCULAR | Status: AC
  Filled 2022-07-20: qty 2

## 2022-07-20 MED ORDER — DROPERIDOL 2.5 MG/ML IJ SOLN
INTRAMUSCULAR | Status: DC | PRN
  Administered 2022-07-20: .625 mg via INTRAVENOUS

## 2022-07-20 MED ORDER — CLINDAMYCIN PHOSPHATE 900 MG/50ML IV SOLN
INTRAVENOUS | Status: AC
  Filled 2022-07-20: qty 50

## 2022-07-20 MED ORDER — MEPERIDINE HCL 25 MG/ML IJ SOLN: 6.2500 mg | INTRAMUSCULAR | Status: DC | PRN

## 2022-07-20 MED ORDER — LIDOCAINE 2% (20 MG/ML) 5 ML SYRINGE
INTRAMUSCULAR | Status: AC
  Filled 2022-07-20: qty 5

## 2022-07-20 MED ORDER — IBUPROFEN 800 MG PO TABS
800.0000 mg | ORAL_TABLET | Freq: Three times a day (TID) | ORAL | 0 refills | Status: AC | PRN
Start: 2022-07-20 — End: ?

## 2022-07-20 MED ORDER — HYDROMORPHONE HCL 1 MG/ML IJ SOLN
INTRAMUSCULAR | Status: AC
  Filled 2022-07-20: qty 0.5

## 2022-07-20 MED ORDER — FENTANYL CITRATE (PF) 100 MCG/2ML IJ SOLN
INTRAMUSCULAR | Status: AC
  Filled 2022-07-20: qty 2

## 2022-07-20 MED ORDER — MIDAZOLAM HCL 2 MG/2ML IJ SOLN
INTRAMUSCULAR | Status: AC
  Filled 2022-07-20: qty 2

## 2022-07-20 MED ORDER — PROPOFOL 10 MG/ML IV BOLUS
INTRAVENOUS | Status: AC
  Filled 2022-07-20: qty 20

## 2022-07-20 MED ORDER — LACTATED RINGERS IV SOLN: INTRAVENOUS | Status: DC

## 2022-07-20 MED ORDER — DEXAMETHASONE SODIUM PHOSPHATE 10 MG/ML IJ SOLN
INTRAMUSCULAR | Status: DC | PRN
  Administered 2022-07-20: 10 mg via INTRAVENOUS

## 2022-07-20 MED ORDER — PROPOFOL 500 MG/50ML IV EMUL
INTRAVENOUS | Status: DC | PRN
  Administered 2022-07-20: 25 ug/kg/min via INTRAVENOUS

## 2022-07-20 MED ORDER — SODIUM CHLORIDE 0.9 % IV SOLN
INTRAVENOUS | Status: AC
  Filled 2022-07-20: qty 10

## 2022-07-20 MED ORDER — HYDROMORPHONE HCL 1 MG/ML IJ SOLN
0.2500 mg | INTRAMUSCULAR | Status: DC | PRN
  Administered 2022-07-20: 0.25 mg via INTRAVENOUS

## 2022-07-20 MED ORDER — BUPIVACAINE HCL (PF) 0.25 % IJ SOLN
INTRAMUSCULAR | Status: DC | PRN
  Administered 2022-07-20: 20 mL

## 2022-07-20 MED ORDER — MIDAZOLAM HCL 5 MG/5ML IJ SOLN
INTRAMUSCULAR | Status: DC | PRN
  Administered 2022-07-20: 2 mg via INTRAVENOUS

## 2022-07-20 MED ORDER — PHENYLEPHRINE HCL (PRESSORS) 10 MG/ML IV SOLN
INTRAVENOUS | Status: DC | PRN
  Administered 2022-07-20 (×2): 80 ug via INTRAVENOUS

## 2022-07-20 MED ORDER — FENTANYL CITRATE (PF) 100 MCG/2ML IJ SOLN
INTRAMUSCULAR | Status: DC | PRN
  Administered 2022-07-20: 50 ug via INTRAVENOUS

## 2022-07-20 SURGICAL SUPPLY — 49 items
ADH SKN CLS APL DERMABOND .7 (GAUZE/BANDAGES/DRESSINGS) ×1
APL PRP STRL LF DISP 70% ISPRP (MISCELLANEOUS) ×1
APPLIER CLIP 9.375 MED OPEN (MISCELLANEOUS)
APR CLP MED 9.3 20 MLT OPN (MISCELLANEOUS)
BINDER BREAST LRG (GAUZE/BANDAGES/DRESSINGS) IMPLANT
BINDER BREAST MEDIUM (GAUZE/BANDAGES/DRESSINGS) IMPLANT
BINDER BREAST XLRG (GAUZE/BANDAGES/DRESSINGS) IMPLANT
BINDER BREAST XXLRG (GAUZE/BANDAGES/DRESSINGS) IMPLANT
BLADE SURG 15 STRL LF DISP TIS (BLADE) ×2 IMPLANT
BLADE SURG 15 STRL SS (BLADE) ×2
CANISTER SUC SOCK COL 7IN (MISCELLANEOUS) IMPLANT
CANISTER SUCT 1200ML W/VALVE (MISCELLANEOUS) IMPLANT
CHLORAPREP W/TINT 26 (MISCELLANEOUS) ×2 IMPLANT
CLIP APPLIE 9.375 MED OPEN (MISCELLANEOUS) IMPLANT
COVER BACK TABLE 60X90IN (DRAPES) ×2 IMPLANT
COVER MAYO STAND STRL (DRAPES) ×2 IMPLANT
COVER PROBE CYLINDRICAL 5X96 (MISCELLANEOUS) ×2 IMPLANT
DERMABOND ADVANCED .7 DNX12 (GAUZE/BANDAGES/DRESSINGS) ×2 IMPLANT
DRAPE LAPAROSCOPIC ABDOMINAL (DRAPES) IMPLANT
DRAPE LAPAROTOMY 100X72 PEDS (DRAPES) ×2 IMPLANT
DRAPE UTILITY XL STRL (DRAPES) ×2 IMPLANT
ELECT COATED BLADE 2.86 ST (ELECTRODE) ×2 IMPLANT
ELECT REM PT RETURN 9FT ADLT (ELECTROSURGICAL) ×1
ELECTRODE REM PT RTRN 9FT ADLT (ELECTROSURGICAL) ×2 IMPLANT
GLOVE BIOGEL PI IND STRL 8 (GLOVE) ×2 IMPLANT
GLOVE ECLIPSE 8.0 STRL XLNG CF (GLOVE) ×2 IMPLANT
GOWN STRL REUS W/ TWL LRG LVL3 (GOWN DISPOSABLE) ×4 IMPLANT
GOWN STRL REUS W/ TWL XL LVL3 (GOWN DISPOSABLE) ×2 IMPLANT
GOWN STRL REUS W/TWL LRG LVL3 (GOWN DISPOSABLE) ×1
GOWN STRL REUS W/TWL XL LVL3 (GOWN DISPOSABLE) ×1
HEMOSTAT ARISTA ABSORB 3G PWDR (HEMOSTASIS) IMPLANT
HEMOSTAT SNOW SURGICEL 2X4 (HEMOSTASIS) IMPLANT
KIT MARKER MARGIN INK (KITS) ×2 IMPLANT
NDL HYPO 25X1 1.5 SAFETY (NEEDLE) ×2 IMPLANT
NEEDLE HYPO 25X1 1.5 SAFETY (NEEDLE) ×1 IMPLANT
NS IRRIG 1000ML POUR BTL (IV SOLUTION) ×2 IMPLANT
PACK BASIN DAY SURGERY FS (CUSTOM PROCEDURE TRAY) ×2 IMPLANT
PENCIL SMOKE EVACUATOR (MISCELLANEOUS) ×2 IMPLANT
SLEEVE SCD COMPRESS KNEE MED (STOCKING) ×2 IMPLANT
SPIKE FLUID TRANSFER (MISCELLANEOUS) IMPLANT
SPONGE T-LAP 4X18 ~~LOC~~+RFID (SPONGE) ×2 IMPLANT
SUT MNCRL AB 4-0 PS2 18 (SUTURE) ×2 IMPLANT
SUT SILK 2 0 SH (SUTURE) IMPLANT
SUT VICRYL 3-0 CR8 SH (SUTURE) ×2 IMPLANT
SYR CONTROL 10ML LL (SYRINGE) ×2 IMPLANT
TOWEL GREEN STERILE FF (TOWEL DISPOSABLE) ×2 IMPLANT
TRAY FAXITRON CT DISP (TRAY / TRAY PROCEDURE) ×2 IMPLANT
TUBE CONNECTING 20X1/4 (TUBING) IMPLANT
YANKAUER SUCT BULB TIP NO VENT (SUCTIONS) IMPLANT

## 2022-07-20 NOTE — Anesthesia Preprocedure Evaluation (Addendum)
Anesthesia Evaluation  Patient identified by MRN, date of birth, ID band Patient awake    Reviewed: Allergy & Precautions, H&P , NPO status , Patient's Chart, lab work & pertinent test results  History of Anesthesia Complications (+) PONV and history of anesthetic complications  Airway Mallampati: II   Neck ROM: full    Dental no notable dental hx.    Pulmonary asthma    breath sounds clear to auscultation       Cardiovascular negative cardio ROS  Rhythm:regular Rate:Normal     Neuro/Psych  PSYCHIATRIC DISORDERS Anxiety Depression       GI/Hepatic ,GERD  ,,  Endo/Other    Renal/GU      Musculoskeletal   Abdominal  (+) + obese  Peds  Hematology   Anesthesia Other Findings   Reproductive/Obstetrics Right breast mass                             Anesthesia Physical Anesthesia Plan  ASA: 2  Anesthesia Plan: General   Post-op Pain Management: Minimal or no pain anticipated   Induction: Intravenous  PONV Risk Score and Plan: 4 or greater and Ondansetron, Dexamethasone, Midazolam, Treatment may vary due to age or medical condition and Droperidol  Airway Management Planned: LMA  Additional Equipment:   Intra-op Plan:   Post-operative Plan: Extubation in OR  Informed Consent: I have reviewed the patients History and Physical, chart, labs and discussed the procedure including the risks, benefits and alternatives for the proposed anesthesia with the patient or authorized representative who has indicated his/her understanding and acceptance.     Dental advisory given  Plan Discussed with: CRNA, Anesthesiologist and Surgeon  Anesthesia Plan Comments:         Anesthesia Quick Evaluation

## 2022-07-20 NOTE — Discharge Instructions (Addendum)
[facility] Office Phone Number [phone]  BREAST BIOPSY/ PARTIAL MASTECTOMY: POST OP INSTRUCTIONS  Always review your discharge instruction sheet given to you by the facility where your surgery was performed.  IF YOU HAVE DISABILITY OR FAMILY LEAVE FORMS, YOU MUST BRING THEM TO THE OFFICE FOR PROCESSING.  DO NOT GIVE THEM TO YOUR DOCTOR.  A prescription for pain medication may be given to you upon discharge.  Take your pain medication as prescribed, if needed.  If narcotic pain medicine is not needed, then you may take acetaminophen (Tylenol) or ibuprofen (Advil) as needed. Take your usually prescribed medications unless otherwise directed If you need a refill on your pain medication, please contact your pharmacy.  They will contact our office to request authorization.  Prescriptions will not be filled after 5pm or on week-ends. You should eat very light the first 24 hours after surgery, such as soup, crackers, pudding, etc.  Resume your normal diet the day after surgery. Most patients will experience some swelling and bruising in the breast.  Ice packs and a good support bra will help.  Swelling and bruising can take several days to resolve.  It is common to experience some constipation if taking pain medication after surgery.  Increasing fluid intake and taking a stool softener will usually help or prevent this problem from occurring.  A mild laxative (Milk of Magnesia or Miralax) should be taken according to package directions if there are no bowel movements after 48 hours. Unless discharge instructions indicate otherwise, you may remove your bandages 24-48 hours after surgery, and you may shower at that time.  You may have steri-strips (small skin tapes) in place directly over the incision.  These strips should be left on the skin for 7-10 days.  If your surgeon used skin glue on the incision, you may shower in 24 hours.  The glue will flake off over the next 2-3 weeks.  Any  sutures or staples will be removed at the office during your follow-up visit. ACTIVITIES:  You may resume regular daily activities (gradually increasing) beginning the next day.  Wearing a good support bra or sports bra minimizes pain and swelling.  You may have sexual intercourse when it is comfortable. You may drive when you no longer are taking prescription pain medication, you can comfortably wear a seatbelt, and you can safely maneuver your car and apply brakes. RETURN TO WORK:  ______________________________________________________________________________________ You should see your doctor in the office for a follow-up appointment approximately two weeks after your surgery.  Your doctor's nurse will typically make your follow-up appointment when she calls you with your pathology report.  Expect your pathology report 2-3 business days after your surgery.  You may call to check if you do not hear from us after three days. OTHER INSTRUCTIONS: _______________________________________________________________________________________________ _____________________________________________________________________________________________________________________________________ _____________________________________________________________________________________________________________________________________ _____________________________________________________________________________________________________________________________________  WHEN TO CALL YOUR DOCTOR: Fever over 101.0 Nausea and/or vomiting. Extreme swelling or bruising. Continued bleeding from incision. Increased pain, redness, or drainage from the incision.  The clinic staff is available to answer your questions during regular business hours.  Please don't hesitate to call and ask to speak to one of the nurses for clinical concerns.  If you have a medical emergency, go to the nearest emergency room or call 911.  A surgeon from [facility] is always on call at the hospital.  For further questions, please visit [email or web address]    Post Anesthesia Home Care Instructions  Activity: Get plenty of rest for the remainder of   the day. A responsible individual must stay with you for 24 hours following the procedure.  For the next 24 hours, DO NOT: -Drive a car -Operate machinery -Drink alcoholic beverages -Take any medication unless instructed by your physician -Make any legal decisions or sign important papers.  Meals: Start with liquid foods such as gelatin or soup. Progress to regular foods as tolerated. Avoid greasy, spicy, heavy foods. If nausea and/or vomiting occur, drink only clear liquids until the nausea and/or vomiting subsides. Call your physician if vomiting continues.  Special Instructions/Symptoms: Your throat may feel dry or sore from the anesthesia or the breathing tube placed in your throat during surgery. If this causes discomfort, gargle with warm salt water. The discomfort should disappear within 24 hours.  If you had a scopolamine patch placed behind your ear for the management of post- operative nausea and/or vomiting:  1. The medication in the patch is effective for 72 hours, after which it should be removed.  Wrap patch in a tissue and discard in the trash. Wash hands thoroughly with soap and water. 2. You may remove the patch earlier than 72 hours if you experience unpleasant side effects which may include dry mouth, dizziness or visual disturbances. 3. Avoid touching the patch. Wash your hands with soap and water after contact with the patch.      

## 2022-07-20 NOTE — Anesthesia Procedure Notes (Signed)
Procedure Name: LMA Insertion Date/Time: 07/20/2022 7:27 AM  Performed by: Thornell Mule, CRNAPre-anesthesia Checklist: Patient identified, Emergency Drugs available, Suction available and Patient being monitored Patient Re-evaluated:Patient Re-evaluated prior to induction Oxygen Delivery Method: Circle system utilized Preoxygenation: Pre-oxygenation with 100% oxygen Induction Type: IV induction LMA: LMA inserted LMA Size: 4.0 Placement Confirmation: positive ETCO2 Tube secured with: Tape Dental Injury: Teeth and Oropharynx as per pre-operative assessment

## 2022-07-20 NOTE — Anesthesia Postprocedure Evaluation (Signed)
Anesthesia Post Note  Patient: Ebony Herman  Procedure(s) Performed: LEFT BREAST LUMPECTOMY WITH RADIOACTIVE SEED LOCALIZATION (Left: Breast)     Patient location during evaluation: PACU Anesthesia Type: General Level of consciousness: awake and alert Pain management: pain level controlled Vital Signs Assessment: post-procedure vital signs reviewed and stable Respiratory status: spontaneous breathing, nonlabored ventilation and respiratory function stable Cardiovascular status: blood pressure returned to baseline and stable Postop Assessment: no apparent nausea or vomiting Anesthetic complications: no   No notable events documented.  Last Vitals:  Vitals:   07/20/22 0900 07/20/22 0914  BP: 131/89 135/85  Pulse: 72 73  Resp: 18 16  Temp:  36.4 C  SpO2: 95% 98%    Last Pain:  Vitals:   07/20/22 0914  TempSrc:   PainSc: 2                  Lowella Curb

## 2022-07-20 NOTE — Transfer of Care (Signed)
Immediate Anesthesia Transfer of Care Note  Patient: Ebony Herman  Procedure(s) Performed: LEFT BREAST LUMPECTOMY WITH RADIOACTIVE SEED LOCALIZATION (Left: Breast)  Patient Location: PACU  Anesthesia Type:General  Level of Consciousness: drowsy and patient cooperative  Airway & Oxygen Therapy: Patient Spontanous Breathing and Patient connected to face mask oxygen  Post-op Assessment: Report given to RN and Post -op Vital signs reviewed and stable  Post vital signs: Reviewed and stable  Last Vitals:  Vitals Value Taken Time  BP    Temp 36.2 C 07/20/22 0813  Pulse    Resp    SpO2      Last Pain:  Vitals:   07/20/22 0638  TempSrc: Temporal  PainSc: 0-No pain      Patients Stated Pain Goal: 5 (07/20/22 1610)  Complications: No notable events documented.

## 2022-07-20 NOTE — Interval H&P Note (Signed)
History and Physical Interval Note:  07/20/2022 7:15 AM  Ebony Herman  has presented today for surgery, with the diagnosis of LEFT BREAST RADIAL SCAR.  The various methods of treatment have been discussed with the patient and family. After consideration of risks, benefits and other options for treatment, the patient has consented to  Procedure(s): LEFT BREAST LUMPECTOMY WITH RADIOACTIVE SEED LOCALIZATION (Left) as a surgical intervention.  The patient's history has been reviewed, patient examined, no change in status, stable for surgery.  I have reviewed the patient's chart and labs.  Questions were answered to the patient's satisfaction.   The procedure has been discussed with the patient. Alternatives to surgery have been discussed with the patient.  Risks of surgery include bleeding,  Infection,  Seroma formation, death,  and the need for further surgery.   The patient understands and wishes to proceed.   Gavina Dildine A Ebony Herman

## 2022-07-20 NOTE — H&P (Signed)
History of Present Illness: Ebony Herman is a 62 y.o. female who is seen today as an office consultation for evaluation of Follow-up (NEW PROBLEM - Left complex sclerosing lesion,)  Patient presents for evaluation of asymmetry left breast detected on mammogram. Has a history of a right breast lumpectomy in 2023 due to previous scar. Core biopsy on the left showed radial scar and fibrocystic change. She presents today to discuss excision of her radial scar. No history of breast pain, nipple discharge or breast mass. Rescheduled for revision of her left implant but that is following through due to insurance reasons.  Review of Systems: A complete review of systems was obtained from the patient. I have reviewed this information and discussed as appropriate with the patient. See HPI as well for other ROS.    Medical History: No past medical history on file.  There is no problem list on file for this patient.  Past Surgical History:  Procedure Laterality Date  breast augmentation and lift    Allergies  Allergen Reactions  Fluticasone Propionate Other (See Comments)  bleeding  Penicillins Hives  itch   Current Outpatient Medications on File Prior to Visit  Medication Sig Dispense Refill  dextromethorphan-bupropion (AUVELITY) 45-105 mg TbIE Take by mouth 2 (two) times daily  ALPRAZolam (XANAX) 0.5 MG tablet Take by mouth  APLENZIN 348 mg Tb24 (Patient not taking: Reported on 06/05/2022)  doxepin (SINEQUAN) 50 MG capsule Take 50 mg by mouth at bedtime (Patient not taking: Reported on 06/05/2022)  lamoTRIgine (LAMICTAL) 100 MG tablet Take 100 mg by mouth 2 (two) times daily  VRAYLAR 3 mg capsule (Patient not taking: Reported on 06/05/2022)  VYVANSE 50 mg capsule Take 50 mg by mouth every morning  zolpidem (AMBIEN) 10 mg tablet   No current facility-administered medications on file prior to visit.   Family History  Problem Relation Age of Onset  Obesity Mother  High blood pressure  (Hypertension) Mother  Hyperlipidemia (Elevated cholesterol) Mother  Breast cancer Mother  Colon cancer Father  Myocardial Infarction (Heart attack) Father    Social History   Tobacco Use  Smoking Status Never  Smokeless Tobacco Never    Social History   Socioeconomic History  Marital status: Divorced  Tobacco Use  Smoking status: Never  Smokeless tobacco: Never   Objective:  There were no vitals filed for this visit.  There is no height or weight on file to calculate BMI.  Physical Exam HENT:  Head: Normocephalic.  Cardiovascular:  Rate and Rhythm: Normal rate.  Pulmonary:  Effort: Pulmonary effort is normal.  Chest:  Breasts: Right: No inverted nipple, mass or nipple discharge.  Left: Normal. No inverted nipple, mass or nipple discharge.  Comments: Scar on right  Musculoskeletal:  General: Normal range of motion.  Cervical back: Normal range of motion.  Lymphadenopathy:  Upper Body:  Right upper body: No supraclavicular or axillary adenopathy.  Left upper body: No supraclavicular or axillary adenopathy.  Skin: General: Skin is warm.  Neurological:  Mental Status: She is alert.  Psychiatric:  Mood and Affect: Mood normal.  Behavior: Behavior normal.     Labs, Imaging and Diagnostic Testing:  CLINICAL DATA: Patient was recalled from screening mammogram for possible asymmetry and distortion in the left breast.  EXAM: DIGITAL DIAGNOSTIC UNILATERAL LEFT MAMMOGRAM WITH IMPLANTS, TOMOSYNTHESIS  TECHNIQUE: Left digital diagnostic mammography and breast tomosynthesis was performed. Standard and/or implant displaced views were performed.  COMPARISON: Previous exam(s).  ACR Breast Density Category c: The breast tissue  is heterogeneously dense, which may obscure small masses.  FINDINGS: Additional imaging of the left breast was performed. There are 2 areas of persistent distortion in the left breast best seen on the spot cc view. They are located in  the anterior third medial aspect of the breast and posterior third retroareolar aspect of the breast. The patient has retropectoral implants.  IMPRESSION: Two areas of persistent distortion in the left breast.  RECOMMENDATION: Stereotactic biopsy of the 2 areas of distortion in the left breast is recommended. The biopsies will be scheduled at the patient's convenience.  I have discussed the findings and recommendations with the patient. If applicable, a reminder letter will be sent to the patient regarding the next appointment.  BI-RADS CATEGORY 4: Suspicious.   Electronically Signed By: Baird Lyons M.D. On: 05/05/2022 13:34 Diagnosis 1. Breast, left, needle core biopsy, posterior upper, x clip BENIGN BREAST WITH FIBROCYSTIC CHANGES INCLUDING STROMAL FIBROSIS, CYSTIC DILATATION OF DUCTS AND FOCAL DUCT HYPERPLASIA NEGATIVE FOR MICROCALCIFICATIONS NEGATIVE FOR ATYPIA AND CARCINOMA 2. Breast, left, needle core biopsy, upper, inner quadrant, anterior, coil clip COMPLEX SCLEROSING LESION (2 MM) FIBROCYSTIC CHANGES INCLUDING STROMAL FIBROSIS, CYSTIC DILATATION OF DUCTS, ADENOSIS AND FOCAL USUAL DUCT HYPERPLASIA NEGATIVE FOR MICROCALCIFICATIONS NEGATIVE FOR ATYPIA AND CARCINOMA Diagnosis Note 1. -2. Diagnoses called to [place] at [facility] by Dr. Venetia Night on 05/12/2022 at 9:18 AM. Jerene Bears MD Pathologist, Electronic Signature (Case signed 05/12/2022) Specimen Gross and Clinical Information Specimen Comment 1. TIF: 10:36 AM, CIT 2 minutes; 2 left breast architectural distortions 2. TIF: 10:51 AM, CIT 2 minutes Specimen(s) Obtained: 1. Breast, left, needle core biopsy, posterior upper, x clip 2. Breast, left, needle core biopsy, upper, inner quadrant, anterior, coil clip 1 of Assessment and Plan:   Diagnoses and all orders for this visit:  Radial scar of breast   Recommend left breast seed lumpectomy for radial scar left breast upper  outer quadrant. Discussed potential upgrade risk. Discussed complications. Discussed observation. Patient wishes to proceed with left breast seed lumpectomy.The procedure has been discussed with the patient. Alternatives to surgery have been discussed with the patient. Risks of surgery include bleeding, Infection, cosmesis, Seroma formation, death, and the need for further surgery. The patient understands and wishes to proceed.    Hayden Rasmussen, MD

## 2022-07-20 NOTE — Op Note (Signed)
Preoperative diagnosis: Left breast radial scar upper outer quadrant  Postoperative diagnosis: Same  Procedure: Left breast seed localized lumpectomy  Surgeon: Harriette Bouillon, MD  Anesthesia: LMA with 0.25% Marcaine plain  EBL: 20 cc  Drains: None  Specimen: Left breast tissue with seed and clip verified by the Faxitron  IV fluids: Per anesthesia record  Indications for procedure: The patient is a 62 year old female presents for left breast lumpectomy.  Risks and benefits reviewed and rationale for surgery reviewed.  Discussed the pros and cons of surgery and observation.  She agreed to proceed.The procedure has been discussed with the patient. Alternatives to surgery have been discussed with the patient.  Risks of surgery include bleeding,  Infection,  Seroma formation, death,  and the need for further surgery.   The patient understands and wishes to proceed.    Description of procedure: Patient was met in the holding area and questions were answered.  Of note a seed was placed as an outpatient by the radiologist in the left breast.  Films were available for review.  She was taken back to the operating room.  She is placed supine upon the operating room table.  After induction of general anesthesia, left breast was prepped and draped in sterile fashion and timeout performed.  Proper patient, site and procedure verified.  Films were available for review.  Neoprobe was used to identify the seed left breast at around 10:00 adjacent to the nipple areolar complex.  She had a previous reduction and incision was made at this location in a circumareolar position in the previous scar.  Dissection was carried down.  All tissue around the seed and clip were excised with a grossly negative margin.  Hemostasis achieved with cautery.  Local anesthetic infiltrated.  Deep tissue planes closed with 3-0 Vicryl.  4 Monocryl was used to close the skin in a subcuticular fashion.  Dermabond was applied.  Breast  binder placed.  All counts found to be correct.  The patient was awoke extubated taken to recovery in satisfactory condition.

## 2022-07-21 ENCOUNTER — Encounter (HOSPITAL_BASED_OUTPATIENT_CLINIC_OR_DEPARTMENT_OTHER): Payer: Self-pay | Admitting: Surgery

## 2022-07-21 LAB — SURGICAL PATHOLOGY

## 2022-07-21 IMAGING — DX MM BREAST SURGICAL SPECIMEN
1 series · 2 of 2 positions shown · non-contrast
Comparison: None Available.

CLINICAL DATA: Evaluate specimen

EXAM:
SPECIMEN RADIOGRAPH OF THE RIGHT BREAST

[Series 1: specimen digital x-ray · right · 0.07mm/px · 2 of 2 slices shown]
[im 1/2]
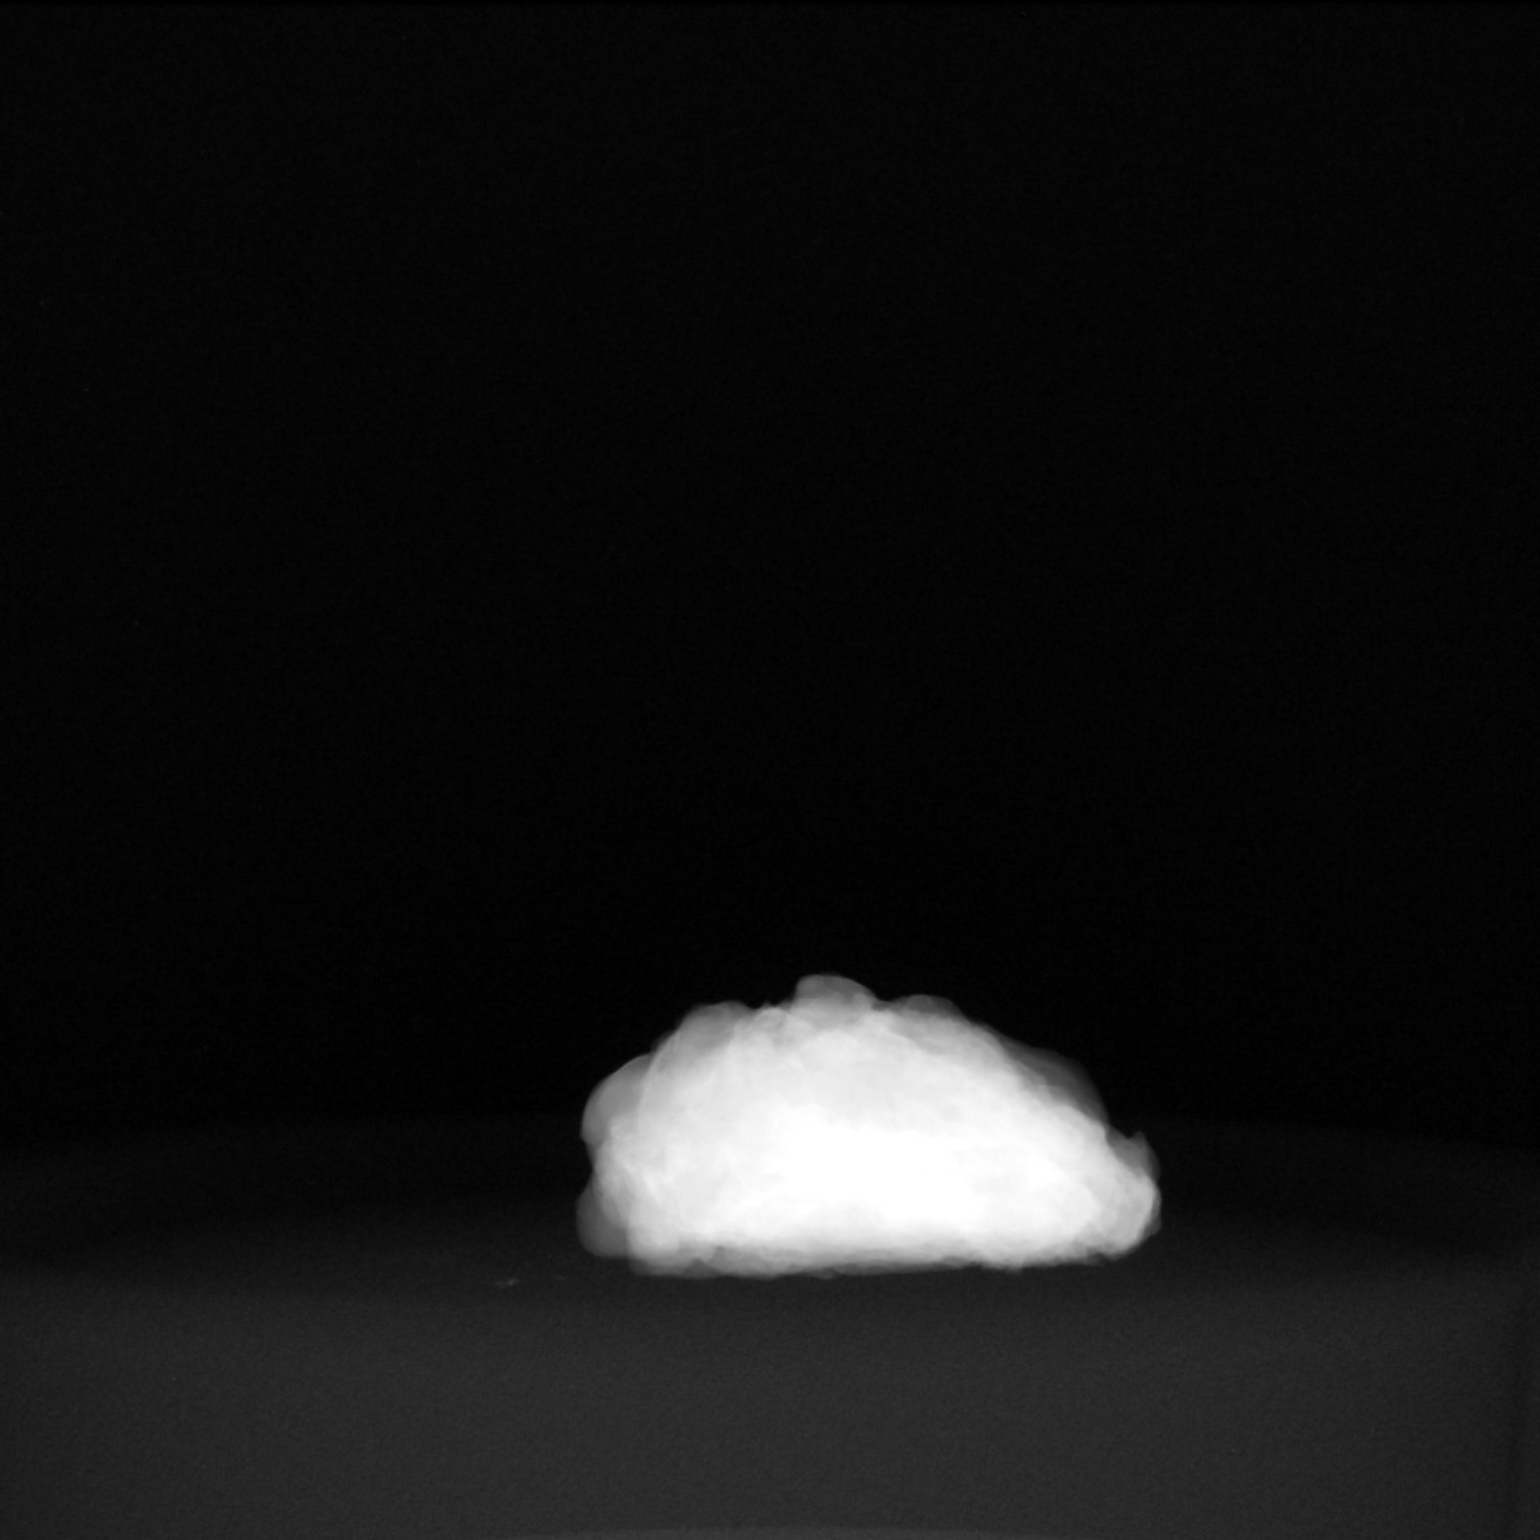
[im 2/2]
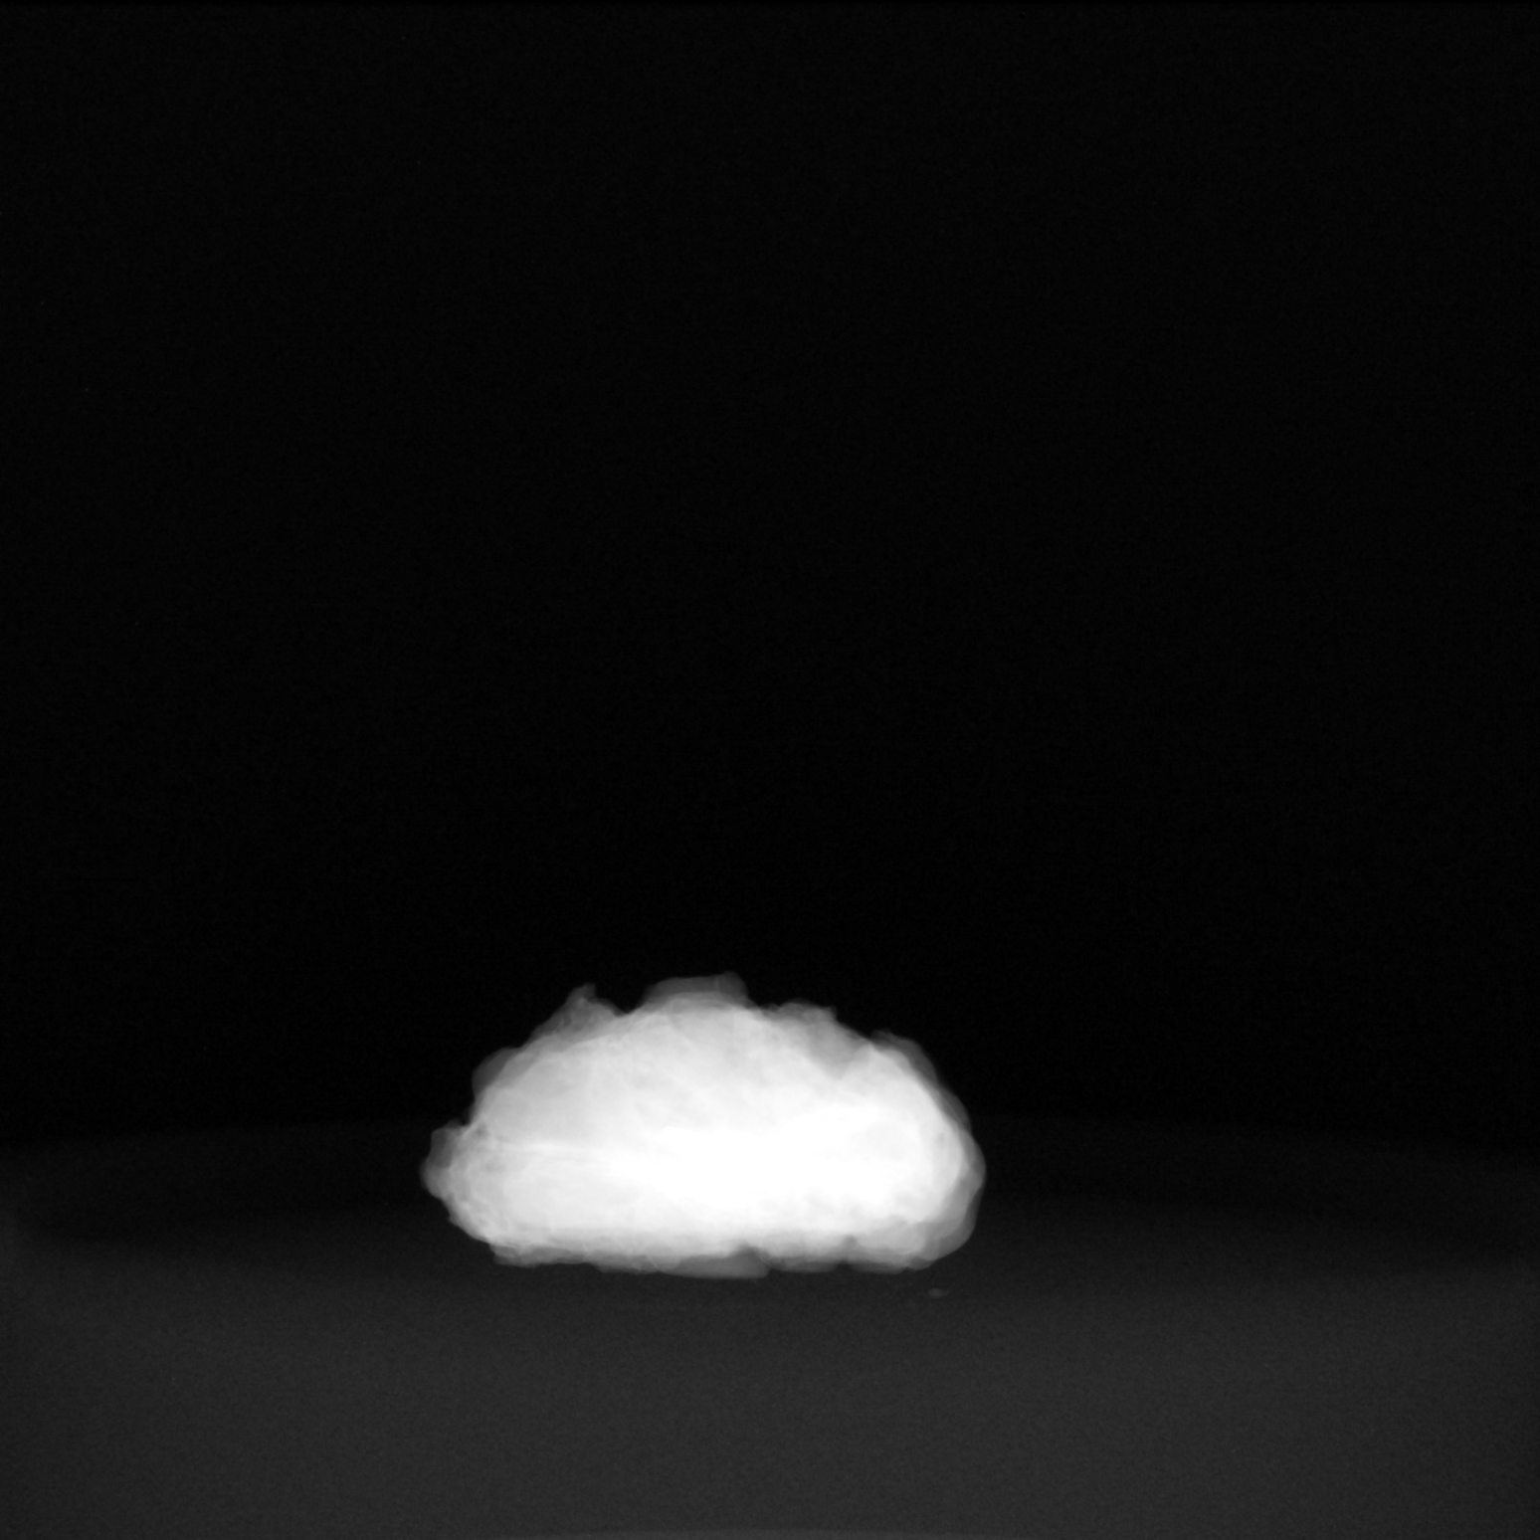

[2 of 2 positions shown; findings below may reference images not displayed]

FINDINGS: Status post excision of the right breast. The radioactive seed and
biopsy marker clip are present, completely intact, and were marked
for pathology.
IMPRESSION: Specimen radiograph of the right breast.

## 2022-07-24 ENCOUNTER — Encounter: Payer: Self-pay | Admitting: Surgery

## 2022-08-28 ENCOUNTER — Ambulatory Visit: Payer: Self-pay | Admitting: Surgery

## 2023-03-07 ENCOUNTER — Other Ambulatory Visit (HOSPITAL_COMMUNITY): Payer: Self-pay

## 2023-03-07 ENCOUNTER — Encounter (HOSPITAL_COMMUNITY): Payer: Self-pay

## 2023-03-07 MED ORDER — LISDEXAMFETAMINE DIMESYLATE 50 MG PO CAPS
50.0000 mg | ORAL_CAPSULE | Freq: Every morning | ORAL | 0 refills | Status: AC
  Filled 2023-03-07: qty 30, 30d supply, fill #0

## 2023-03-12 ENCOUNTER — Other Ambulatory Visit (HOSPITAL_COMMUNITY): Payer: Self-pay

## 2023-09-24 ENCOUNTER — Encounter (INDEPENDENT_AMBULATORY_CARE_PROVIDER_SITE_OTHER): Payer: Self-pay

## 2023-09-24 ENCOUNTER — Encounter (INDEPENDENT_AMBULATORY_CARE_PROVIDER_SITE_OTHER): Payer: Self-pay | Admitting: Adult Health

## 2023-09-24 ENCOUNTER — Ambulatory Visit (INDEPENDENT_AMBULATORY_CARE_PROVIDER_SITE_OTHER): Admitting: Adult Health

## 2023-09-24 VITALS — BP 150/91 | HR 88 | Temp 98.2°F | Ht 62.0 in | Wt 239.0 lb

## 2023-09-24 DIAGNOSIS — F419 Anxiety disorder, unspecified: Secondary | ICD-10-CM

## 2023-09-24 DIAGNOSIS — Z6841 Body Mass Index (BMI) 40.0 and over, adult: Secondary | ICD-10-CM

## 2023-09-24 DIAGNOSIS — R739 Hyperglycemia, unspecified: Secondary | ICD-10-CM | POA: Diagnosis not present

## 2023-09-24 DIAGNOSIS — Z0000 Encounter for general adult medical examination without abnormal findings: Secondary | ICD-10-CM

## 2023-09-24 DIAGNOSIS — F32A Depression, unspecified: Secondary | ICD-10-CM

## 2023-09-24 DIAGNOSIS — Z0289 Encounter for other administrative examinations: Secondary | ICD-10-CM

## 2023-09-24 NOTE — Progress Notes (Signed)
 Office: [phone]  /  Fax: [phone]   Initial Visit    Ebony Herman was seen in clinic today to evaluate for obesity. She is interested in losing weight to improve overall health and reduce the risk of weight related complications. She presents today to review program treatment options, initial physical assessment, and evaluation.     She was referred by: Self-Referral  When asked what else they would like to accomplish? She states: Adopt a healthier eating pattern and lifestyle, Improve energy levels and physical activity, Improve existing medical conditions, Improve quality of life, and Current Weight 239 lbs, Interval Goal < 200 lbs   When asked how has your weight affected you? She states: Relationships, Contributed to medical problems, Contributed to orthopedic problems or mobility issues, Having fatigue, Having poor endurance, Problems with eating patterns, and Has affected mood  Per pt- The degree that my weight impacts my life, my current mental state related to it and everything else are understated as is the degree of anxiety.  Weight history: Per patient: I've had an unhealthy relationship with my weight since a young age  Highest weight: 240 lbs  Some associated conditions: Other: Elevated CBG  Contributing factors: family history of obesity, disruption of circadian rhythm / sleep disordered breathing, consumption of processed foods, use of obesogenic medications: Other: PRN Xanax, moderate to high levels of stress, and reduced physical activity  Weight promoting medications identified: Other: PRN Xanax  Prior weight loss attempts: Weight Watchers, Low Carb, and Meal Replacements Per pt-[employer] plan was probably most doable/sustainable. Per pt- Also, this speaks of putting me on low carb, high protein diet which I stated I have great difficulty with as I do not eat seafood, pork or beef and have difficulty eating much poultry. This leaves me with few options  and while I lose weight on low carb plans, it is not at all sustainable for me. One reason I lose on low carb plans is that I just don't eat.  Current nutrition plan: None  Current level of physical activity: None  Current or previous pharmacotherapy: Phentermine  Response to medication: Lost weight initially but was unable to sustain weight loss   Past medical history includes:   Past Medical History:  Diagnosis Date   Allergy    Anxiety    Asthma    Depression    Insomnia    Post-operative nausea and vomiting    Seasonal allergies      Objective    BP (!) 150/91   Pulse 88   Temp 98.2 F (36.8 C)   Ht 5' 2 (1.575 m)   Wt 239 lb (108.4 kg)   SpO2 98%   BMI 43.71 kg/m  She was weighed on the bioimpedance scale: Body mass index is 43.71 kg/m.  Body Fat%:50.6, Visceral Fat Rating:17, Weight trend over the last 12 months: Increasing  General:  Alert, oriented and cooperative. Patient is in no acute distress.  Respiratory: Normal respiratory effort, no problems with respiration noted   Gait: able to ambulate independently  Mental Status: Normal mood and affect. Normal behavior. Normal judgment and thought content.   DIAGNOSTIC DATA REVIEWED:  BMET No results found for: NA, K, CL, CO2, GLUCOSE, BUN, CREATININE, CALCIUM, GFRNONAA, GFRAA No results found for: HGBA1C No results found for: INSULIN CBC    Component Value Date/Time   HGB 12.6 04/29/2014 1358   Iron/TIBC/Ferritin/ %Sat No results found for: IRON, TIBC, FERRITIN, IRONPCTSAT Lipid Panel  No results found  for: CHOL, TRIG, HDL, CHOLHDL, VLDL, LDLCALC, LDLDIRECT Hepatic Function Panel  No results found for: PROT, ALBUMIN, AST, ALT, ALKPHOS, BILITOT, BILIDIR, IBILI No results found for: TSH   Assessment and Plan   Healthcare maintenance  Elevated random blood glucose level  Anxiety and depression  Morbid obesity (HCC), STARTING BMMI  43.7   Assessment and Plan          ESTABLISH WITH [facility]    Obesity Treatment / Action Plan:  Patient will work on garnering support from family and friends to begin weight loss journey. Will work on eliminating or reducing the presence of highly palatable, calorie dense foods in the home. Will complete provided nutritional and psychosocial assessment questionnaire before the next appointment. Will be scheduled for indirect calorimetry to determine resting energy expenditure in a fasting state.  This will allow us  to create a reduced calorie, high-protein meal plan to promote loss of fat mass while preserving muscle mass. Counseled on the health benefits of losing 5%-15% of total body weight. Was counseled on nutritional approaches to weight loss and benefits of reducing processed foods and consuming plant-based foods and high quality protein as part of nutritional weight management. Was counseled on pharmacotherapy and role as an adjunct in weight management.   Obesity Education Performed Today:  She was weighed on the bioimpedance scale and results were discussed and documented in the synopsis.  We discussed obesity as a disease and the importance of a more detailed evaluation of all the factors contributing to the disease.  We discussed the importance of long term lifestyle changes which include nutrition, exercise and behavioral modifications as well as the importance of customizing this to her specific health and social needs.  We discussed the benefits of reaching a healthier weight to alleviate the symptoms of existing conditions and reduce the risks of the biomechanical, metabolic and psychological effects of obesity.  We reviewed the four pillars of obesity medicine and importance of using a multimodal approach.  We reviewed the basic principles in weight management.   Ebony Herman appears to be in the action stage of change and states they are ready to start intensive  lifestyle modifications and behavioral modifications.  I have spent 30 minutes in the care of the patient today including: 5 minutes before the visit reviewing and preparing the chart. 20 minutes face-to-face assessing and reviewing listed medical problems as outlined in obesity care plan, providing nutritional and behavioral counseling on topics outlined in the obesity care plan, counseling regarding anti-obesity medication as outlined in obesity care plan, independently interpreting test results and goals of care, as described in assessment and plan, reviewing and discussing biometric information and progress, and reviewing latest PCP notes and specialist consultations 5 minutes after the visit updating chart and documentation of encounter.  Reviewed by clinician on day of visit: allergies, medications, problem list, medical history, surgical history, family history, social history, and previous encounter notes pertinent to obesity diagnosis.  Annaelle Kasel d. Letecia Arps, NP-C

## 2023-09-26 ENCOUNTER — Encounter (INDEPENDENT_AMBULATORY_CARE_PROVIDER_SITE_OTHER): Payer: Self-pay | Admitting: Adult Health

## 2023-10-01 ENCOUNTER — Ambulatory Visit (INDEPENDENT_AMBULATORY_CARE_PROVIDER_SITE_OTHER): Admitting: Family Medicine

## 2023-10-01 ENCOUNTER — Encounter (INDEPENDENT_AMBULATORY_CARE_PROVIDER_SITE_OTHER): Payer: Self-pay | Admitting: Family Medicine

## 2023-10-01 VITALS — BP 131/81 | HR 91 | Temp 97.9°F | Ht 62.0 in | Wt 239.0 lb

## 2023-10-01 DIAGNOSIS — D649 Anemia, unspecified: Secondary | ICD-10-CM

## 2023-10-01 DIAGNOSIS — F419 Anxiety disorder, unspecified: Secondary | ICD-10-CM | POA: Diagnosis not present

## 2023-10-01 DIAGNOSIS — R5383 Other fatigue: Secondary | ICD-10-CM | POA: Diagnosis not present

## 2023-10-01 DIAGNOSIS — R0602 Shortness of breath: Secondary | ICD-10-CM | POA: Diagnosis not present

## 2023-10-01 DIAGNOSIS — F50819 Binge eating disorder, unspecified: Secondary | ICD-10-CM

## 2023-10-01 DIAGNOSIS — F32A Depression, unspecified: Secondary | ICD-10-CM

## 2023-10-01 DIAGNOSIS — R739 Hyperglycemia, unspecified: Secondary | ICD-10-CM

## 2023-10-01 DIAGNOSIS — Z1331 Encounter for screening for depression: Secondary | ICD-10-CM

## 2023-10-01 DIAGNOSIS — Z6841 Body Mass Index (BMI) 40.0 and over, adult: Secondary | ICD-10-CM

## 2023-10-01 NOTE — Progress Notes (Signed)
 Chief Complaint:  Obesity   Subjective:  Ebony Herman (MR# [id number]) is a 63 y.o. female who presents for evaluation and treatment of obesity and related comorbidities.   Ebony Herman is currently in the action stage of change and ready to dedicate time achieving and maintaining a healthier weight. Ebony Herman is interested in becoming our patient and working on intensive lifestyle modifications including (but not limited to) diet and exercise for weight loss.  Ebony Herman has been struggling with her weight. She has been unsuccessful in either losing weight, maintaining weight loss, or reaching her healthy weight goal. She mentions she has struggled with weight her whole life.  She doesn't like to cook.  She doesn't eat beef or pork and doesn't eat any seafood.  She was a vegetarian for 7-8 years.  She can lose weight on low carb. She previously did Weight Watchers and felt that she did well on this because it gave her option to eat foods she liked.  She didn't regain the weight as quickly back after Weight Watchers.  Gained quite a bit of weight during menopause and lost interest in everything. Have contemplated weight loss surgery previously. Lives alone but states her family is supportive of her and she occasionally eats meals with them.  Desired weight is 140-160lb.  Eats out 2-3 times a week.  She often skips meals and grazes throughout the day.  She will go thru a drive thru to grab food if she is out and about.   Food Recall: Oatmeal in the am- maple brown sugar 2 packets.  She will likely forget about eating for a while.  She has been craving ice cream and will get [employer] pints.  She may do something like granola bars but will eat one and then eat more and more and more.  She is hit or miss with salty crunchy like chips. She can tolerate protein shakes.  She likes vegetarian chili.  She can tolerate cereal.  Indirect Calorimeter completed today shows a RMR: 2304.  Her calculated basal metabolic rate is  1684 thus her basal metabolic rate is better than expected.  Other Fatigue Wave admits to daytime somnolence and admits to waking up still tired. Patient has a history of symptoms of morning headache. Ebony Herman generally gets 6 or 7 hours of sleep per night, and states that she has restless sleep. Snoring is present. Apneic episodes are not present. Epworth Sleepiness Score is 5.   Shortness of Breath Ebony Herman notes increasing shortness of breath with exercising and seems to be worsening over time with weight gain. She notes getting out of breath sooner with activity than she used to. This has not gotten worse recently. Ebony Herman denies shortness of breath at rest or orthopnea.  Depression Screen Ebony Herman (modified PHQ-9) score was 28.     10/01/2023    7:56 AM  Depression screen PHQ 2/9  Decreased Interest 0  Down, Depressed, Hopeless 3  PHQ - 2 Score 3  Altered sleeping 3  Tired, decreased energy 3  Change in appetite 2  Feeling bad or failure about yourself  3  Trouble concentrating 1  Moving slowly or fidgety/restless 3  Suicidal thoughts 1  PHQ-9 Score 19  Difficult doing work/chores Very difficult     Objective:  Vitals Temp: 97.9 F (36.6 C) BP: 131/81 Pulse Rate: 91 SpO2: 98 %   Anthropometric Measurements Height: 5' 2 (1.575 m) Weight: 239 lb (108.4 kg) BMI (Calculated): 43.7 Starting Weight: 239 lb  Peak Weight: 240 lb Waist Measurement : 42 inches   Body Composition  Body Fat %: 51 % Fat Mass (lbs): 122.2 lbs Muscle Mass (lbs): 111.6 lbs Total Body Water (lbs): 87.4 lbs Visceral Fat Rating : 18   Other Clinical Data RMR: 2304 Fasting: yes Labs: yes Today's Visit #: 1 Starting Date: 10/01/23    EKG: Normal sinus rhythm, rate 82.  General: Cooperative, alert, well developed, in no acute distress. HEENT: Conjunctivae and lids unremarkable. Cardiovascular: Regular rhythm.  Lungs: Normal work of breathing. Neurologic: No focal deficits.    No results found for: CREATININE, BUN, NA, K, CL, CO2 No results found for: ALT, AST, GGT, ALKPHOS, BILITOT No results found for: HGBA1C No results found for: INSULIN  No results found for: TSH No results found for: CHOL, HDL, LDLCALC, LDLDIRECT, TRIG, CHOLHDL Lab Results  Component Value Date   HGB 12.6 04/29/2014   No results found for: IRON, TIBC, FERRITIN  Assessment and Plan:  Assessment & Plan Other fatigue  SOBOE (shortness of breath on exertion)  Anemia, unspecified type Hemoglobin and RBC low.  MCV was normal but RDW elevated.  Will draw CBC and anemia panel today. Depression screening  Binge eating disorder, unspecified severity Patient on vyvanse  and mentions that this does not control her binge episodes in the evening.  She feels uncontrolled with her episodes.  She has a psychiatrist that is managing Anxiety and depression Working with psych to find medication combination that will work for her.  She has quite a bit of mental health tied to food and weight.  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   Hyperglycemia Elevated blood sugar on CMP in Epic.  No A1c available for assessment. A1c and Insulin  level today. BMI 40.0-44.9, adult (HCC)  Morbid obesity (HCC)    Other Fatigue  Ebony Herman does feel that her weight is causing her energy to be lower than it should be. Fatigue may be related to obesity, depression or many other causes. Labs will be ordered, and in the meanwhile, Ebony Herman will focus on self care including making healthy food choices, increasing physical activity and focusing on stress reduction.  Shortness of Breath  Ebony Herman does feel that she gets out of breath more easily that she used to when she exercises. Ebony Herman shortness of breath appears to be obesity related and exercise induced. She has agreed to work on weight loss and gradually increase exercise  to treat her exercise induced shortness of breath. Will continue to monitor closely.   Problem List Items Addressed This Visit   None Visit Diagnoses       Other fatigue    -  Primary   Relevant Orders   EKG 12-Lead     SOBOE (shortness of breath on exertion)         Depression screening         BMI 40.0-44.9, adult (HCC)         Morbid obesity (HCC)           Ebony Herman is currently in the action stage of change and her goal is to continue with weight loss efforts. I recommend Ebony Herman begin the structured treatment plan as follows:  She has agreed to keeping a food journal and adhering to recommended goals of 1800-1900 calories and 125 or more grams of protein, Pescatarian Plan, and Vegetarian Plan  Exercise goals: No exercise has been prescribed at this time.  Behavioral modification strategies:increasing Ebony Herman protein intake, decreasing  simple carbohydrates, no skipping meals, meal planning and cooking strategies, and emotional eating strategies   She was informed of the importance of frequent follow-up visits to maximize her success with intensive lifestyle modifications for her multiple health conditions. She was informed we would discuss her lab results at her next visit unless there is a critical issue that needs to be addressed sooner. Ebony Herman agreed to keep her next visit at the agreed upon time to discuss these results.  Labs ordered with plans to discuss at the next visit.   Attestation Statements:  Reviewed by clinician on day of visit: allergies, medications, problem list, medical history, surgical history, family history, social history, and previous encounter notes.  This is the patient's first visit at [facility]. The patient's NEW PATIENT PACKET was reviewed at length. Included in the packet: current and past health history, medications, allergies, ROS, gynecologic history (women only), surgical history, family history, social history, weight history, weight  loss surgery history (for those that have had weight loss surgery), nutritional evaluation, Herman and food questionnaire, PHQ9, Epworth questionnaire, sleep habits questionnaire, patient life and health improvement goals questionnaire. These will all be scanned into the patient's chart under media.   During the visit, I independently reviewed the patient's EKG, bioimpedance scale results, and indirect calorimeter results. I used this information to tailor a meal plan for the patient that will help her to lose weight and will improve her obesity-related conditions going forward. I performed a medically necessary appropriate examination and/or evaluation. I discussed the assessment and treatment plan with the patient. The patient was provided an opportunity to ask questions and all were answered. The patient agreed with the plan and demonstrated an understanding of the instructions. Labs were ordered at this visit and will be reviewed at the next visit unless more critical results need to be addressed immediately. Clinical information was updated and documented in the EMR.     Ebony Herman Cho, MD

## 2023-10-03 LAB — CBC WITH DIFFERENTIAL/PLATELET
Basophils Absolute: 0.1 x10E3/uL (ref 0.0–0.2)
Basos: 1 %
EOS (ABSOLUTE): 0.3 x10E3/uL (ref 0.0–0.4)
Eos: 4 %
Hemoglobin: 11.3 g/dL (ref 11.1–15.9)
Immature Grans (Abs): 0 x10E3/uL (ref 0.0–0.1)
Immature Granulocytes: 0 %
Lymphocytes Absolute: 2.6 x10E3/uL (ref 0.7–3.1)
Lymphs: 34 %
MCH: 25.9 pg — ABNORMAL LOW (ref 26.6–33.0)
MCHC: 30.4 g/dL — ABNORMAL LOW (ref 31.5–35.7)
MCV: 85 fL (ref 79–97)
Monocytes Absolute: 0.7 x10E3/uL (ref 0.1–0.9)
Monocytes: 9 %
Neutrophils Absolute: 4 x10E3/uL (ref 1.4–7.0)
Neutrophils: 52 %
Platelets: 302 x10E3/uL (ref 150–450)
RBC: 4.36 x10E6/uL (ref 3.77–5.28)
RDW: 15.6 % — ABNORMAL HIGH (ref 11.7–15.4)
WBC: 7.7 x10E3/uL (ref 3.4–10.8)

## 2023-10-03 LAB — COMPREHENSIVE METABOLIC PANEL WITH GFR
ALT: 50 IU/L — ABNORMAL HIGH (ref 0–32)
AST: 52 IU/L — ABNORMAL HIGH (ref 0–40)
Albumin: 4.3 g/dL (ref 3.9–4.9)
Alkaline Phosphatase: 129 IU/L — ABNORMAL HIGH (ref 44–121)
BUN/Creatinine Ratio: 16 (ref 12–28)
BUN: 14 mg/dL (ref 8–27)
Bilirubin Total: 0.3 mg/dL (ref 0.0–1.2)
CO2: 19 mmol/L — ABNORMAL LOW (ref 20–29)
Calcium: 9.2 mg/dL (ref 8.7–10.3)
Chloride: 102 mmol/L (ref 96–106)
Creatinine, Ser: 0.86 mg/dL (ref 0.57–1.00)
Globulin, Total: 2.7 g/dL (ref 1.5–4.5)
Glucose: 112 mg/dL — ABNORMAL HIGH (ref 70–99)
Potassium: 4.5 mmol/L (ref 3.5–5.2)
Sodium: 138 mmol/L (ref 134–144)
Total Protein: 7 g/dL (ref 6.0–8.5)
eGFR: 77 mL/min/1.73 (ref >59)

## 2023-10-03 LAB — ANEMIA PANEL
Ferritin: 17 ng/mL (ref 15–150)
Folate, Hemolysate: 381 ng/mL
Folate, RBC: 1024 ng/mL (ref >498)
Hematocrit: 37.2 % (ref 34.0–46.6)
Iron Saturation: 11 % — ABNORMAL LOW (ref 15–55)
Iron: 45 ug/dL (ref 27–139)
Retic Ct Pct: 1.4 % (ref 0.6–2.6)
Total Iron Binding Capacity: 421 ug/dL (ref 250–450)
UIBC: 376 ug/dL — ABNORMAL HIGH (ref 118–369)
Vitamin B-12: 315 pg/mL (ref 232–1245)

## 2023-10-03 LAB — LIPID PANEL WITH LDL/HDL RATIO
Cholesterol, Total: 219 mg/dL — ABNORMAL HIGH (ref 100–199)
HDL: 90 mg/dL (ref >39)
LDL Chol Calc (NIH): 120 mg/dL — ABNORMAL HIGH (ref 0–99)
LDL/HDL Ratio: 1.3 ratio (ref 0.0–3.2)
Triglycerides: 54 mg/dL (ref 0–149)
VLDL Cholesterol Cal: 9 mg/dL (ref 5–40)

## 2023-10-03 LAB — THYROID PANEL WITH TSH
Free Thyroxine Index: 2.3 (ref 1.2–4.9)
T3 Uptake Ratio: 27 % (ref 24–39)
T4, Total: 8.7 ug/dL (ref 4.5–12.0)
TSH: 1.92 u[IU]/mL (ref 0.450–4.500)

## 2023-10-03 LAB — HEMOGLOBIN A1C
Est. average glucose Bld gHb Est-mCnc: 123 mg/dL
Hgb A1c MFr Bld: 5.9 % — ABNORMAL HIGH (ref 4.8–5.6)

## 2023-10-03 LAB — FOLATE: Folate: 11.9 ng/mL (ref >3.0)

## 2023-10-03 LAB — INSULIN, RANDOM: INSULIN: 13.9 u[IU]/mL (ref 2.6–24.9)

## 2023-10-03 LAB — VITAMIN D 25 HYDROXY (VIT D DEFICIENCY, FRACTURES): Vit D, 25-Hydroxy: 19.5 ng/mL — ABNORMAL LOW (ref 30.0–100.0)

## 2023-10-22 ENCOUNTER — Ambulatory Visit (INDEPENDENT_AMBULATORY_CARE_PROVIDER_SITE_OTHER): Admitting: Family Medicine

## 2023-10-22 ENCOUNTER — Other Ambulatory Visit (INDEPENDENT_AMBULATORY_CARE_PROVIDER_SITE_OTHER): Payer: Self-pay | Admitting: Family Medicine

## 2023-10-22 ENCOUNTER — Encounter (INDEPENDENT_AMBULATORY_CARE_PROVIDER_SITE_OTHER): Payer: Self-pay | Admitting: Family Medicine

## 2023-10-22 VITALS — BP 153/91 | HR 99 | Temp 98.4°F | Ht 62.0 in | Wt 240.0 lb

## 2023-10-22 DIAGNOSIS — R7303 Prediabetes: Secondary | ICD-10-CM | POA: Diagnosis not present

## 2023-10-22 DIAGNOSIS — R7989 Other specified abnormal findings of blood chemistry: Secondary | ICD-10-CM | POA: Diagnosis not present

## 2023-10-22 DIAGNOSIS — E559 Vitamin D deficiency, unspecified: Secondary | ICD-10-CM | POA: Diagnosis not present

## 2023-10-22 DIAGNOSIS — E782 Mixed hyperlipidemia: Secondary | ICD-10-CM | POA: Diagnosis not present

## 2023-10-22 DIAGNOSIS — E785 Hyperlipidemia, unspecified: Secondary | ICD-10-CM | POA: Insufficient documentation

## 2023-10-22 DIAGNOSIS — Z6841 Body Mass Index (BMI) 40.0 and over, adult: Secondary | ICD-10-CM

## 2023-10-22 MED ORDER — WEGOVY 0.25 MG/0.5ML ~~LOC~~ SOAJ: 0.2500 mg | SUBCUTANEOUS | 0 refills | Status: AC

## 2023-10-22 MED ORDER — VITAMIN D (ERGOCALCIFEROL) 1.25 MG (50000 UNIT) PO CAPS: 50000.0000 [IU] | ORAL_CAPSULE | ORAL | 0 refills | Status: AC

## 2023-10-22 NOTE — Assessment & Plan Note (Signed)
 Discussed importance of vitamin d supplementation.  Vitamin d supplementation has been shown to decrease fatigue, decrease risk of progression to insulin resistance and then prediabetes, decreases risk of falling in older age and can even assist in decreasing depressive symptoms in PTSD.   Prescription for Vitamin D sent in.

## 2023-10-22 NOTE — Assessment & Plan Note (Signed)

## 2023-10-22 NOTE — Progress Notes (Unsigned)
 SUBJECTIVE:  Chief Complaint: Obesity  Interim History: Patient here for first follow up.  She forgot to bring her food log but she can tell what she was eating. She ate thru the food she had at her house and did well following her meal plan and lost 6-7lbs then got triggered and went on a major binge.  She got herself together and lost the pounds again.  She had to go home because her mom was not doing well and living at a nursing home.  Her mother ended up dying.  She had had a difficult last few weeks.  The structured plans don't normally work for her due to not being able to consistently get food in.  She was getting in over 75g of protein daily and about 1300 calories daily.  She has a few events coming up- concert tomorrow, her mother's donnamae is in 5 days.  Ebony Herman is here to discuss her progress with her obesity treatment plan. She is on the keeping a food journal and adhering to recommended goals of 1800-1900 calories and 125 grams of protein and Vegetarian Plan and states she is following her eating plan approximately 50 % of the time. She states she is doing cleaning and housework.   OBJECTIVE: Visit Diagnoses: Problem List Items Addressed This Visit       Other   Vitamin D  deficiency - Primary   Discussed importance of vitamin d  supplementation.  Vitamin d  supplementation has been shown to decrease fatigue, decrease risk of progression to insulin  resistance and then prediabetes, decreases risk of falling in older age and can even assist in decreasing depressive symptoms in PTSD.   Prescription for Vitamin D  sent in.        Relevant Medications   Vitamin D , Ergocalciferol , (DRISDOL ) 1.25 MG (50000 UNIT) CAPS capsule   Prediabetes   Pathophysiology of progression through insulin  resistance to prediabetes and diabetes was discussed at length today.  Patient to continue to monitor and be in control of total intake of snack calories which may be simple carbohydrates but should be  consumed only after the patient has taken in all the nutrition for the day.  Macronutrient identification, classification and daily intake ratios were discussed.  Plan to repeat labs in 3 months to monitor both hemoglobin A1c and insulin  levels.  No medications at this time as patient is not having significant hunger or cravings that would make following meal plan more difficult.         Elevated LFTs   HLD (hyperlipidemia)   The 10-year ASCVD risk score (Arnett DK, et al., 2019) is: 4.1%   Values used to calculate the score:     Age: 24 years     Clincally relevant sex: Female     Is Non-Hispanic African American: No     Diabetic: No     Tobacco smoker: No     Systolic Blood Pressure: 153 mmHg     Is BP treated: No     HDL Cholesterol: 90 mg/dL     Total Cholesterol: 219 mg/dL       Other Visit Diagnoses       Morbid obesity (HCC)       Relevant Medications   Semaglutide -Weight Management (WEGOVY ) 0.25 MG/0.5ML SOAJ     BMI 40.0-44.9, adult (HCC)       Relevant Medications   Semaglutide -Weight Management (WEGOVY ) 0.25 MG/0.5ML SOAJ       Vitals Temp: 98.4 F (36.9 C) BP: (!) 153/91  Pulse Rate: 99 SpO2: 99 %   Anthropometric Measurements Height: 5' 2 (1.575 m) Weight: 240 lb (108.9 kg) BMI (Calculated): 43.89 Weight at Last Visit: 239 lb Weight Lost Since Last Visit: 0 Weight Gained Since Last Visit: 1 Starting Weight: 239 lb   Body Composition  Body Fat %: 51.7 % Fat Mass (lbs): 124.4 lbs Muscle Mass (lbs): 110.6 lbs Total Body Water (lbs): 90.4 lbs Visceral Fat Rating : 18   Other Clinical Data Today's Visit #: 2 Starting Date: 10/01/23 Comments: 1800-1900/125, veg     ASSESSMENT AND PLAN: Assessment & Plan Vitamin D  deficiency Discussed importance of vitamin d  supplementation.  Vitamin d  supplementation has been shown to decrease fatigue, decrease risk of progression to insulin  resistance and then prediabetes, decreases risk of falling in  older age and can even assist in decreasing depressive symptoms in PTSD.   Prescription for Vitamin D  sent in.   Prediabetes Pathophysiology of progression through insulin  resistance to prediabetes and diabetes was discussed at length today.  Patient to continue to monitor and be in control of total intake of snack calories which may be simple carbohydrates but should be consumed only after the patient has taken in all the nutrition for the day.  Macronutrient identification, classification and daily intake ratios were discussed.  Plan to repeat labs in 3 months to monitor both hemoglobin A1c and insulin  levels.  No medications at this time as patient is not having significant hunger or cravings that would make following meal plan more difficult.    Elevated LFTs Etiology is like hepatic steatosis but no imaging or biopsy on file.  Patient encouraged to continue working on lifestyle and nutrition changes.  Will reach out to PCP to get prior labs to see if Hep C tested and what prior values are. Morbid obesity (HCC)  BMI 40.0-44.9, adult (HCC)  Mixed hyperlipidemia The 10-year ASCVD risk score (Arnett DK, et al., 2019) is: 4.1%   Values used to calculate the score:     Age: 63 years     Clincally relevant sex: Female     Is Non-Hispanic African American: No     Diabetic: No     Tobacco smoker: No     Systolic Blood Pressure: 153 mmHg     Is BP treated: No     HDL Cholesterol: 90 mg/dL     Total Cholesterol: 219 mg/dL   \ Diet: Minha is currently in the action stage of change. As such, her goal is to continue with weight loss efforts and has agreed to keeping a food journal and adhering to recommended goals of 1800 calories and 125 or more grams of protein, the Pescatarian Plan, and the Vegetarian Plan.   Exercise:  For substantial health benefits, adults should do at least 150 minutes (2 hours and 30 minutes) a week of moderate-intensity, or 75 minutes (1 hour and 15 minutes) a week of  vigorous-intensity aerobic physical activity, or an equivalent combination of moderate- and vigorous-intensity aerobic activity. Aerobic activity should be performed in episodes of at least 10 minutes, and preferably, it should be spread throughout the week.  Behavior Modification:  We discussed the following Behavioral Modification Strategies today: increasing lean protein intake, decreasing simple carbohydrates, meal planning and cooking strategies, keeping healthy foods in the home, and avoiding temptations. We discussed various medication options to help Anvita with her weight loss efforts and we both agreed to start wegovy  if insurance covers it at starting dose of 0.25mg  weekly.  We discussed risks, benefits and possible side effects.  Return in about 3 weeks (around 11/12/2023).   She was informed of the importance of frequent follow up visits to maximize her success with intensive lifestyle modifications for her multiple health conditions.  Attestation Statements:   Reviewed by clinician on day of visit: allergies, medications, problem list, medical history, surgical history, family history, social history, and previous encounter notes.     Adelita Cho, MD

## 2023-10-22 NOTE — Assessment & Plan Note (Signed)
 The 10-year ASCVD risk score (Arnett DK, et al., 2019) is: 4.1%   Values used to calculate the score:     Age: 63 years     Clincally relevant sex: Female     Is Non-Hispanic African American: No     Diabetic: No     Tobacco smoker: No     Systolic Blood Pressure: 153 mmHg     Is BP treated: No     HDL Cholesterol: 90 mg/dL     Total Cholesterol: 219 mg/dL

## 2023-10-24 NOTE — Assessment & Plan Note (Signed)
 Etiology is like hepatic steatosis but no imaging or biopsy on file.  Patient encouraged to continue working on lifestyle and nutrition changes.  Will reach out to PCP to get prior labs to see if Hep C tested and what prior values are.

## 2023-10-25 ENCOUNTER — Encounter (INDEPENDENT_AMBULATORY_CARE_PROVIDER_SITE_OTHER): Payer: Self-pay | Admitting: Family Medicine

## 2023-10-29 ENCOUNTER — Other Ambulatory Visit (INDEPENDENT_AMBULATORY_CARE_PROVIDER_SITE_OTHER): Payer: Self-pay | Admitting: Family Medicine

## 2023-10-29 ENCOUNTER — Institutional Professional Consult (permissible substitution) (INDEPENDENT_AMBULATORY_CARE_PROVIDER_SITE_OTHER): Admitting: Physician Assistant

## 2023-11-05 NOTE — Telephone Encounter (Signed)
 Your PA request cannot be processed for the member plan submitted. For further inquiries please contact the number on the back of the member prescription card. [phone], phones open 8-6 central time

## 2023-11-06 ENCOUNTER — Ambulatory Visit (INDEPENDENT_AMBULATORY_CARE_PROVIDER_SITE_OTHER): Admitting: Family Medicine

## 2023-11-06 ENCOUNTER — Encounter (INDEPENDENT_AMBULATORY_CARE_PROVIDER_SITE_OTHER): Payer: Self-pay | Admitting: Family Medicine

## 2023-11-06 VITALS — BP 116/75 | HR 89 | Temp 98.2°F | Ht 62.0 in | Wt 233.0 lb

## 2023-11-06 DIAGNOSIS — F32A Depression, unspecified: Secondary | ICD-10-CM | POA: Diagnosis not present

## 2023-11-06 DIAGNOSIS — R7303 Prediabetes: Secondary | ICD-10-CM | POA: Diagnosis not present

## 2023-11-06 DIAGNOSIS — Z6841 Body Mass Index (BMI) 40.0 and over, adult: Secondary | ICD-10-CM

## 2023-11-06 DIAGNOSIS — F419 Anxiety disorder, unspecified: Secondary | ICD-10-CM | POA: Diagnosis not present

## 2023-11-06 NOTE — Progress Notes (Signed)
 SUBJECTIVE:  Chief Complaint: Obesity  Interim History: patient is working toward calorie and protein goal daily.  She is re- establishing care with her PCP.  She is frustrated about lack of coverage for weight loss medication coverage.  She brought her food journal today and feels she isn't doing well on the program. Food journal reviewed today.  She has had a few binge days related to grief concerning her mother.  She felt it was very difficult to get the sandwich down from the meal plan.  She doesn't like a majority of protein.   Ebony Herman is here to discuss her progress with her obesity treatment plan. She is on the keeping a food journal and adhering to recommended goals of 1800 calories and 25 grams of protein and states she is following her eating plan approximately 98 % of the time. She states she is more active.   OBJECTIVE: Visit Diagnoses: Problem List Items Addressed This Visit       Other   Prediabetes   Other Visit Diagnoses       Anxiety and depression    -  Primary     Morbid obesity (HCC)         BMI 40.0-44.9, adult (HCC)           Vitals Temp: 98.2 F (36.8 C) BP: 116/75 Pulse Rate: 89 SpO2: 98 %   Anthropometric Measurements Height: 5' 2 (1.575 m) Weight: 233 lb (105.7 kg) BMI (Calculated): 42.61 Weight at Last Visit: 240 lb Weight Lost Since Last Visit: 7 Weight Gained Since Last Visit: 0 Starting Weight: 239 lb Total Weight Loss (lbs): 6 lb (2.722 kg)   Body Composition  Body Fat %: 48.5 % Fat Mass (lbs): 113.2 lbs Muscle Mass (lbs): 114.4 lbs Total Body Water (lbs): 81.6 lbs Visceral Fat Rating : 16   Other Clinical Data Today's Visit #: 3 Starting Date: 10/01/23 Comments: 1800/125     ASSESSMENT AND PLAN: Assessment & Plan Anxiety and depression Patient is working with psychiatry for approval of Spravato or intranasal ketamine.  She will update me if and when this medication is approved and treatment is  started. Prediabetes Last A1c of 5.9.  Patient has been monitoring her dietary intake through food logging.  She is often lower than total calorie goal and realizes she needs to intake closer to the recommended calorie goal of 1800 cal.  And that 1800 cal her goal protein intake is 125 g.  This would limit her simple carbohydrate intake significantly. Morbid obesity (HCC)  BMI 40.0-44.9, adult (HCC)    Diet: Ebony Herman is currently in the action stage of change. As such, her goal is to continue with weight loss efforts and has agreed to keeping a food journal and adhering to recommended goals of 1800 calories and 125 or more grams of protein.   Exercise:  For substantial health benefits, adults should do at least 150 minutes (2 hours and 30 minutes) a week of moderate-intensity, or 75 minutes (1 hour and 15 minutes) a week of vigorous-intensity aerobic physical activity, or an equivalent combination of moderate- and vigorous-intensity aerobic activity. Aerobic activity should be performed in episodes of at least 10 minutes, and preferably, it should be spread throughout the week.  Behavior Modification:  We discussed the following Behavioral Modification Strategies today: increasing lean protein intake, decreasing simple carbohydrates, increasing vegetables, meal planning and cooking strategies, and planning for success. We discussed various medication options to help Ebony Herman with her weight loss efforts  and medication options were discussed extensively with patient today.  Given availability, cost, insurance coverage and other medical comorbidities the decision was reached to start medications Lomaira and Topiramate.  These medications will be used as a substitute for brand name Qsymia in doses that are synanomous.  Patient understands this is an off label usage.  We discussed the titration schedule with the goal of 5% weight loss at 3 months at a treatment dose.  The first two weeks will be a starting  dose of 25mg  of Topiramate and 4mg  of Lomaira.  After two weeks the patient will increase to 50mg  of Topiramate and 8mg  of Lomaira and will stay on this dose until the next appointment.  Controlled substance contract was discussed and signed today.  Prescriptions held until patient can discuss with psychiatry whether or not they feel comfortable with her being on to stimulant medications. .  Return in about 4 weeks (around 12/04/2023).   She was informed of the importance of frequent follow up visits to maximize her success with intensive lifestyle modifications for her multiple health conditions.  Attestation Statements:   Reviewed by clinician on day of visit: allergies, medications, problem list, medical history, surgical history, family history, social history, and previous encounter notes.   Adelita Cho, MD

## 2023-11-06 NOTE — Telephone Encounter (Signed)
 Discussed at visit today.

## 2023-11-07 NOTE — Assessment & Plan Note (Signed)
 Last A1c of 5.9.  Patient has been monitoring her dietary intake through food logging.  She is often lower than total calorie goal and realizes she needs to intake closer to the recommended calorie goal of 1800 cal.  And that 1800 cal her goal protein intake is 125 g.  This would limit her simple carbohydrate intake significantly.

## 2023-12-13 ENCOUNTER — Ambulatory Visit (INDEPENDENT_AMBULATORY_CARE_PROVIDER_SITE_OTHER): Admitting: Family Medicine

## 2024-01-13 ENCOUNTER — Other Ambulatory Visit (INDEPENDENT_AMBULATORY_CARE_PROVIDER_SITE_OTHER): Payer: Self-pay | Admitting: Family Medicine

## 2024-01-13 DIAGNOSIS — E559 Vitamin D deficiency, unspecified: Secondary | ICD-10-CM
